# Patient Record
Sex: Female | Born: 1985 | Race: Asian | Hispanic: No | Marital: Married | Smoking: Never smoker
Health system: Southern US, Community
[De-identification: ages and names within clinical notes are randomized; demographics above are authoritative.]

## PROBLEM LIST (undated history)

## (undated) DIAGNOSIS — F0781 Postconcussional syndrome: Secondary | ICD-10-CM

## (undated) DIAGNOSIS — R519 Headache, unspecified: Secondary | ICD-10-CM

## (undated) DIAGNOSIS — R51 Headache: Secondary | ICD-10-CM

## (undated) HISTORY — DX: Postconcussional syndrome: F07.81

## (undated) HISTORY — DX: Headache: R51

## (undated) HISTORY — DX: Headache, unspecified: R51.9

---

## 2015-03-28 ENCOUNTER — Emergency Department (HOSPITAL_COMMUNITY)
Admission: EM | Admit: 2015-03-28 | Discharge: 2015-03-28 | Disposition: A | Discharge status: Home / Self Care | Payer: Medicaid Other | Source: Home / Self Care | Attending: Family Medicine | Admitting: Family Medicine

## 2015-03-28 ENCOUNTER — Encounter (HOSPITAL_COMMUNITY): Payer: Self-pay | Admitting: Emergency Medicine

## 2015-03-28 DIAGNOSIS — M7652 Patellar tendinitis, left knee: Secondary | ICD-10-CM

## 2015-03-28 MED ORDER — DICLOFENAC SODIUM 1 % TD GEL: 1.0000 "application " | Freq: Four times a day (QID) | TRANSDERMAL | Status: DC

## 2015-03-28 NOTE — Discharge Instructions (Signed)
Patellar Tendinitis, Jumper's Knee with Rehab Tendinitis is inflammation of a tendon. Tendonitis of the tendon below the kneecap (patella) is known as patellar tendonitis. Patellar tendonitis is a common cause of pain below the kneecap (infrapatellar). Patellar tendonitis may involve a tear (strain) in the ligament. Strains are classified into three categories. Grade 1 strains cause pain, but the tendon is not lengthened. Grade 2 strains include a lengthened ligament, due to the ligament being stretched or partially ruptured. With grade 2 strains there is still function, although function may be decreased. Grade 3 strains involve a complete tear of the tendon or muscle, and function is usually impaired. Patellar tendon strains are usually grade 1 or 2.  SYMPTOMS   Pain, tenderness, swelling, warmth, or redness over the patellar tendon (just below the kneecap).  Pain and loss of strength (sometimes), with forcefully straightening the knee (especially when jumping or rising from a seated or squatting position), or bending the knee completely (squatting or kneeling).  Crackling sound (crepitation) when the tendon is moved or touched. CAUSES  Patellar tendonitis is caused by injury to the patellar tendon. The inflammation is the body's healing response. Common causes of injury include:  Stress from a sudden increase in intensity, frequency, or duration of training.  Overuse of the thigh muscles (quadriceps) and patellar tendon.  Direct hit (trauma) to the knee or patellar tendon. RISK INCREASES WITH:  Sports that require sudden, explosive quadriceps contraction, such as jumping, quick starts, or kicking.  Running sports, especially running down hills.  Poor strength and flexibility of the thigh and knee.  Flat feet. PREVENTION  Warm up and stretch properly before activity.  Allow for adequate recovery between workouts.  Maintain physical fitness:  Strength, flexibility, and  endurance.  Cardiovascular fitness.  Protect the knee joint with taping, protective strapping, bracing, or elastic compression bandage.  Wear arch supports (orthotics). PROGNOSIS  If treated properly, patellar tendonitis usually heals within 6 weeks.  RELATED COMPLICATIONS   Longer healing time if not properly treated or if not given enough time to heal.  Recurring symptoms if activity is resumed too soon, with overuse, with a direct blow, or when using poor technique.  If untreated, tendon rupture requiring surgery. TREATMENT Treatment first involves the use of ice and medicine to reduce pain and inflammation. The use of strengthening and stretching exercises may help reduce pain with activity. These exercises may be performed at home or with a therapist. Serious cases of tendonitis may require restraining the knee for 10 to 14 days to prevent stress on the tendon and to promote healing. Crutches may be used (uncommon) until you can walk without a limp. For cases in which nonsurgical treatment is unsuccessful, surgery may be advised to remove the inflamed tendon lining (sheath). Surgery is rare, and is only advised after at least 6 months of nonsurgical treatment. MEDICATION   If pain medicine is needed, nonsteroidal anti-inflammatory medicines (aspirin and ibuprofen), or other minor pain relievers (acetaminophen), are often advised.  Do not take pain medicine for 7 days before surgery.  Prescription pain relievers may be given if your caregiver thinks they are needed. Use only as directed and only as much as you need. HEAT AND COLD  Cold treatment (icing) should be applied for 10 to 15 minutes every 2 to 3 hours for inflammation and pain, and immediately after activity that aggravates your symptoms. Use ice packs or an ice massage.  Heat treatment may be used before performing stretching and strengthening activities   prescribed by your caregiver, physical therapist, or athletic trainer.  Use a heat pack or a warm water soak. SEEK MEDICAL CARE IF:  Symptoms get worse or do not improve in 2 weeks, despite treatment.  New, unexplained symptoms develop. (Drugs used in treatment may produce side effects.) EXERCISES RANGE OF MOTION (ROM) AND STRETCHING EXERCISES - Patellar Tendinitis (Jumper's Knee) These are some of the initial exercises with which you may start your rehabilitation program, until you see your caregiver again or until your symptoms are resolved. Remember:   Flexible tissue is more tolerant of the stresses placed on it during activities.  Each stretch should be held for 20 to 30 seconds.  A gentle stretching sensation should be felt. STRETCH - Hamstrings, Supine  Lie on your back. Loop a belt or towel over the ball of your right / left foot.  Straighten your right / left knee and slowly pull on the belt to raise your leg. Do not allow the right / left knee to bend. Keep your opposite leg flat on the floor.  Raise the leg until you feel a gentle stretch behind your right / left knee or thigh. Hold this position for __________ seconds. Repeat __________ times. Complete this stretch __________ times per day.  STRETCH - Hamstrings, Doorway  Lie on your back with your right / left leg extended and resting on the wall, and the opposite leg flat on the ground through the door. At first, position your bottom farther away from the wall.  Keep your right / left knee straight. If you feel a stretch behind your knee or thigh, hold this position for __________ seconds.  If you do not feel a stretch, scoot your bottom closer to the door, and hold __________ seconds. Repeat __________ times. Complete this stretch __________ times per day.  STRETCH - Hamstrings, Standing  Stand or sit and extend your right / left leg, placing your foot on a chair or foot stool.  Keep a slight arch in your low back and your hips straight forward.  Lead with your chest and lean forward  at the waist until you feel a gentle stretch in the back of your right / left knee or thigh. (When done correctly, this exercise requires leaning only a small distance.)  Hold this position for __________ seconds. Repeat __________ times. Complete this stretch __________ times per day. STRETCH - Adductors, Lunge  While standing, spread your legs, with your right / left leg behind you.  Lean away from your right / left leg by bending your opposite knee. You may rest your hands on your thigh for balance.  You should feel a stretch in your right / left inner thigh. Hold for __________ seconds. Repeat __________ times. Complete this exercise __________ times per day.  STRENGTHENING EXERCISES - Patellar Tendinitis (Jumper's Knee) These exercises may help you when beginning to rehabilitate your injury. They may resolve your symptoms with or without further involvement from your physician, physical therapist or athletic trainer. While completing these exercises, remember:   Muscles can gain both the endurance and the strength needed for everyday activities through controlled exercises.  Complete these exercises as instructed by your physician, physical therapist or athletic trainer. Increase the resistance and repetitions only as guided by your caregiver. STRENGTH - Quadriceps, Isometrics  Lie on your back with your right / left leg extended and your opposite knee bent.  Gradually tense the muscles in the front of your right / left thigh. You should see   either your kneecap slide up toward your hip or increased dimpling just above the knee. This motion will push the back of the knee down toward the floor, mat, or bed on which you are lying.  Hold the muscle as tight as you can, without increasing your pain, for __________ seconds.  Relax the muscles slowly and completely in between each repetition. Repeat __________ times. Complete this exercise __________ times per day.  STRENGTH - Quadriceps,  Short Arcs  Lie on your back. Place a __________ inch towel roll under your right / left knee, so that the knee bends slightly.  Raise only your lower leg by tightening the muscles in the front of your thigh. Do not allow your thigh to rise.  Hold this position for __________ seconds. Repeat __________ times. Complete this exercise __________ times per day.  OPTIONAL ANKLE WEIGHTS: Begin with ____________________, but DO NOT exceed ____________________. Increase in 1 pound/ 0.5 kilogram increments. STRENGTH - Quadriceps, Straight Leg Raises  Quality counts! Watch for signs that the quadriceps muscle is working, to be sure you are strengthening the correct muscles and not "cheating" by substituting with healthier muscles.  Lay on your back with your right / left leg extended and your opposite knee bent.  Tense the muscles in the front of your right / left thigh. You should see either your kneecap slide up or increased dimpling just above the knee. Your thigh may even shake a bit.  Tighten these muscles even more and raise your leg 4 to 6 inches off the floor. Hold for __________ seconds.  Keeping these muscles tense, lower your leg.  Relax the muscles slowly and completely between each repetition. Repeat __________ times. Complete this exercise __________ times per day.  STRENGTH - Quadriceps, Squats  Stand in a door frame so that your feet and knees are in line with the frame.  Use your hands for balance, not support, on the frame.  Slowly lower your weight, bending at the hips and knees. Keep your lower legs upright so that they are parallel with the door frame. Squat only within the range that does not increase your knee pain. Never let your hips drop below your knees.  Slowly return upright, pushing with your legs, not pulling with your hands. Repeat __________ times. Complete this exercise __________ times per day.  STRENGTH - Quadriceps, Step-Downs  Stand on the edge of a step  stool or stair. Be prepared to use a countertop or wall for balance, if needed.  Keeping your right / left knee directly over the middle of your foot, slowly touch your opposite heel to the floor or lower step. Do not go all the way to the floor if your knee pain increases; just go as far as you can without increased discomfort. Use your right / left leg muscles, not gravity to lower your body weight.  Slowly push your body weight back up to the starting position. Repeat __________ times. Complete this exercise __________ times per day.  Document Released: 10/01/2005 Document Revised: 02/15/2014 Document Reviewed: 01/13/2009 [employer] Patient Information 2015 [employer]. This information is not intended to replace advice given to you by your health care provider. Make sure you discuss any questions you have with your health care provider.  

## 2015-03-28 NOTE — ED Notes (Signed)
Pt states that her left knee has been hurting for a week she denies any injury or fall

## 2015-03-28 NOTE — ED Provider Notes (Signed)
CSN: [id number]     Arrival date & time 03/28/15  1647 History   First MD Initiated Contact with Patient 03/28/15 1736     Chief Complaint  Patient presents with  . Knee Pain   (Consider location/radiation/quality/duration/timing/severity/associated sxs/prior Treatment) HPI Comments: 30 year old female with gradual onset of left anterior knee pain for one week. She states she does not recall any particular event with set of events that may have caused the pain. Her job does require standing for 8 hours a day working.   History reviewed. No pertinent past medical history. History reviewed. No pertinent past surgical history. History reviewed. No pertinent family history. History  Substance Use Topics  . Smoking status: Never Smoker   . Smokeless tobacco: Not on file  . Alcohol Use: No   OB History    No data available     Review of Systems  Constitutional: Negative for fever, chills and activity change.  HENT: Negative.   Respiratory: Negative.   Musculoskeletal:       As per HPI  Skin: Negative for color change.  Neurological: Negative.     Allergies  Review of patient's allergies indicates no known allergies.  Home Medications   Prior to Admission medications   Medication Sig Start Date End Date Taking? Authorizing Provider  diclofenac sodium (VOLTAREN) 1 % GEL Apply 1 application topically 4 (four) times daily. 03/28/15   Hayden Rasmussen, NP   BP 103/61 mmHg  Pulse 74  Temp(Src) 97.8 F (36.6 C) (Oral)  Resp 16  SpO2 100% Physical Exam  Constitutional: She is oriented to person, place, and time. She appears well-developed and well-nourished. No distress.  HENT:  Head: Normocephalic and atraumatic.  Eyes: EOM are normal.  Neck: Normal range of motion. Neck supple.  Pulmonary/Chest: Effort normal. No respiratory distress.  Musculoskeletal: Normal range of motion. She exhibits no edema.  Left knee without swelling, deformity or discoloration. Patient points to the  anterior knee and in particular the patella and patellar ligament as the source of pain. Palpation reveals these areas are tender. There are no areas of bony tenderness to the condyles or epicondyles. No tenderness over the anterior tibia or greater tuberosity.  Flexion and extension is complete. Distal neurovascular motor sensory is intact.  Neurological: She is alert and oriented to person, place, and time. No cranial nerve deficit.  Skin: Skin is warm and dry.  Psychiatric: She has a normal mood and affect.  Nursing note and vitals reviewed.   ED Course  Procedures (including critical care time) Labs Review Labs Reviewed - No data to display  Imaging Review No results found.   MDM   1. Patellar tendinitis of left knee    Ice, stretches, knee rehabilitation and exercise instructions given Diclofenac gel topically. Avoid activity that exacerbates pain. Knee sleeve. Follow-up with your PCP as needed.    Hayden Rasmussen, NP 03/28/15 [phone]

## 2016-01-20 ENCOUNTER — Ambulatory Visit
Admission: RE | Admit: 2016-01-20 | Discharge: 2016-01-20 | Disposition: A | Discharge status: Home / Self Care | Payer: No Typology Code available for payment source | Source: Ambulatory Visit | Attending: Infectious Disease | Admitting: Infectious Disease

## 2016-01-20 ENCOUNTER — Other Ambulatory Visit: Payer: Self-pay | Admitting: Infectious Disease

## 2016-01-20 DIAGNOSIS — Z111 Encounter for screening for respiratory tuberculosis: Secondary | ICD-10-CM

## 2017-05-31 ENCOUNTER — Ambulatory Visit: Payer: Self-pay | Admitting: *Deleted

## 2017-05-31 VITALS — BP 90/69 | HR 67 | Ht 60.0 in | Wt 139.0 lb

## 2017-05-31 DIAGNOSIS — Z0000 Encounter for general adult medical examination without abnormal findings: Secondary | ICD-10-CM

## 2017-05-31 NOTE — Progress Notes (Signed)
[employer] insurance premium discount evaluation: Labs Drawn. Replacements ROI form signed. Tobacco Free Attestation form signed.  Forms placed in paper chart.  No pcp to route results to. 

## 2017-06-01 LAB — CMP12+LP+TP+TSH+6AC+CBC/D/PLT
ALT: 16 IU/L (ref 0–32)
AST: 20 IU/L (ref 0–40)
Albumin/Globulin Ratio: 1.5 (ref 1.2–2.2)
Albumin: 4.3 g/dL (ref 3.5–5.5)
Alkaline Phosphatase: 47 IU/L (ref 39–117)
BASOS ABS: 0 10*3/uL (ref 0.0–0.2)
BILIRUBIN TOTAL: 0.7 mg/dL (ref 0.0–1.2)
BUN/Creatinine Ratio: 16 (ref 9–23)
BUN: 10 mg/dL (ref 6–20)
Basos: 0 %
CHLORIDE: 106 mmol/L (ref 96–106)
CHOL/HDL RATIO: 4.1 ratio (ref 0.0–4.4)
CREATININE: 0.62 mg/dL (ref 0.57–1.00)
Calcium: 9 mg/dL (ref 8.7–10.2)
Cholesterol, Total: 170 mg/dL (ref 100–199)
EOS (ABSOLUTE): 0.1 10*3/uL (ref 0.0–0.4)
ESTIMATED CHD RISK: 0.9 times avg. (ref 0.0–1.0)
Eos: 4 %
Free Thyroxine Index: 3.5 (ref 1.2–4.9)
GFR, EST AFRICAN AMERICAN: 139 mL/min/{1.73_m2} (ref >59)
GFR, EST NON AFRICAN AMERICAN: 121 mL/min/{1.73_m2} (ref >59)
GGT: 10 IU/L (ref 0–60)
Globulin, Total: 2.8 g/dL (ref 1.5–4.5)
Glucose: 91 mg/dL (ref 65–99)
HDL: 41 mg/dL (ref >39)
HEMATOCRIT: 37.3 % (ref 34.0–46.6)
Hemoglobin: 12.2 g/dL (ref 11.1–15.9)
IRON: 96 ug/dL (ref 27–159)
Immature Grans (Abs): 0 10*3/uL (ref 0.0–0.1)
Immature Granulocytes: 0 %
LDH: 202 IU/L (ref 119–226)
LDL CALC: 107 mg/dL — AB (ref 0–99)
LYMPHS ABS: 1.5 10*3/uL (ref 0.7–3.1)
Lymphs: 37 %
MCH: 28.4 pg (ref 26.6–33.0)
MCHC: 32.7 g/dL (ref 31.5–35.7)
MCV: 87 fL (ref 79–97)
Monocytes Absolute: 0.2 10*3/uL (ref 0.1–0.9)
Monocytes: 6 %
NEUTROS ABS: 2.1 10*3/uL (ref 1.4–7.0)
Neutrophils: 53 %
Phosphorus: 3.2 mg/dL (ref 2.5–4.5)
Platelets: 252 10*3/uL (ref 150–379)
Potassium: 4.3 mmol/L (ref 3.5–5.2)
RBC: 4.3 x10E6/uL (ref 3.77–5.28)
RDW: 13 % (ref 12.3–15.4)
SODIUM: 141 mmol/L (ref 134–144)
T3 UPTAKE RATIO: 38 % (ref 24–39)
T4, Total: 9.1 ug/dL (ref 4.5–12.0)
TSH: 1.74 u[IU]/mL (ref 0.450–4.500)
Total Protein: 7.1 g/dL (ref 6.0–8.5)
Triglycerides: 110 mg/dL (ref 0–149)
Uric Acid: 4.5 mg/dL (ref 2.5–7.1)
VLDL CHOLESTEROL CAL: 22 mg/dL (ref 5–40)
WBC: 4 10*3/uL (ref 3.4–10.8)

## 2017-06-01 LAB — HGB A1C W/O EAG: HEMOGLOBIN A1C: 5.3 % (ref 4.8–5.6)

## 2017-06-04 NOTE — Progress Notes (Signed)
Results reviewed with pt. LDL slightly elevated. No comparison labs in epic or paper chart as pt is a new employee. All other labs WNL. Diet and exercise recommendations given for weight management and LDL improvement. Copy provided to pt. No pcp to route results to. No further questions/concerns.

## 2017-10-15 DIAGNOSIS — M549 Dorsalgia, unspecified: Secondary | ICD-10-CM

## 2017-10-15 HISTORY — DX: Dorsalgia, unspecified: M54.9

## 2017-11-04 ENCOUNTER — Other Ambulatory Visit: Payer: Self-pay

## 2017-11-04 ENCOUNTER — Encounter (HOSPITAL_COMMUNITY): Payer: Self-pay | Admitting: Emergency Medicine

## 2017-11-04 ENCOUNTER — Ambulatory Visit (HOSPITAL_COMMUNITY)
Admission: EM | Admit: 2017-11-04 | Discharge: 2017-11-04 | Disposition: A | Discharge status: Home / Self Care | Payer: PRIVATE HEALTH INSURANCE | Attending: Family Medicine | Admitting: Family Medicine

## 2017-11-04 DIAGNOSIS — R21 Rash and other nonspecific skin eruption: Secondary | ICD-10-CM | POA: Diagnosis not present

## 2017-11-04 MED ORDER — PREDNISONE 10 MG (21) PO TBPK: ORAL_TABLET | Freq: Every day | ORAL | 0 refills | Status: DC

## 2017-11-04 NOTE — ED Triage Notes (Signed)
Pt c/o rash bilateral legs x2 weeks, states its also on the lower part of her stomach. Denies new soaps or creams.

## 2017-11-06 NOTE — ED Provider Notes (Signed)
  [name]   [id number] 11/04/17 Arrival Time: 1307  ASSESSMENT & PLAN:  1. Rash and nonspecific skin eruption    Meds ordered this encounter  Medications  . predniSONE (STERAPRED UNI-PAK 21 TAB) 10 MG (21) TBPK tablet    Sig: Take by mouth daily. Take as directed.    Dispense:  21 tablet    Refill:  0   Unsure of exact etiology. Does not appear typical of a vasculitis. Trial of prednisone. If not improving she plans to schedule f/u with a dermatologist. May f/u here as needed.  Reviewed expectations re: course of current medical issues. Questions answered. Outlined signs and symptoms indicating need for more acute intervention. Patient verbalized understanding. After Visit Summary given.   SUBJECTIVE:  Hailey Ferguson is a 32 y.o. female who presents with complaint of:   Rash Patient presents for evaluation of a localized rash involving her lower legs and slighlty on her torso. Onset gradual, 2-3 weeks ago. Reports that the rash has not changed over time and causes no discomfort. No itching. Associated symptoms: none. She denies: abdominal pain, arthralgia, fever, myalgia and sore throat. Reports that she has not had contacts with similar rash. She has not had new exposures (soaps, lotions, laundry detergents, foods, medications, plants, insects or animals). She @has  not identified precipitant. Environmental exposures or allergies: none No recent travel.  ROS: As per HPI.  OBJECTIVE: Vitals:   11/04/17 1415  BP: 99/70  Pulse: 74  Resp: 16  Temp: 98.1 F (36.7 C)  SpO2: 100%    General appearance: alert; no distress Lungs: clear to auscultation bilaterally Heart: regular rate and rhythm Extremities: no edema Skin: warm and dry; non-specific scattered red, small areas (approx 2-735mm) areas of slight erythema on her lower extremities; some on lower abdomen but difficult to make out; non-palpable; some blanch and some do not blanch; little confluence  overall Psychological: alert and cooperative; normal mood and affect  No Known Allergies   Social History   Socioeconomic History  . Marital status: Married    Spouse name: Not on file  . Number of children: Not on file  . Years of education: Not on file  . Highest education level: Not on file  Social Needs  . Financial resource strain: Not on file  . Food insecurity - worry: Not on file  . Food insecurity - inability: Not on file  . Transportation needs - medical: Not on file  . Transportation needs - non-medical: Not on file  Occupational History  . Not on file  Tobacco Use  . Smoking status: Never Smoker  Substance and Sexual Activity  . Alcohol use: No  . Drug use: No  . Sexual activity: Yes  Other Topics Concern  . Not on file  Social History Narrative  . Not on file      Mardella LaymanHagler, Madhavi Hamblen, MD 11/06/17 [phone]

## 2018-05-29 ENCOUNTER — Emergency Department (HOSPITAL_COMMUNITY): Payer: No Typology Code available for payment source

## 2018-05-29 ENCOUNTER — Observation Stay (HOSPITAL_COMMUNITY): Payer: No Typology Code available for payment source

## 2018-05-29 ENCOUNTER — Encounter (HOSPITAL_COMMUNITY): Payer: Self-pay | Admitting: Emergency Medicine

## 2018-05-29 ENCOUNTER — Emergency Department (HOSPITAL_COMMUNITY): Payer: No Typology Code available for payment source | Admitting: Certified Registered Nurse Anesthetist

## 2018-05-29 ENCOUNTER — Observation Stay (HOSPITAL_COMMUNITY)
Admission: EM | Admit: 2018-05-29 | Discharge: 2018-05-30 | Disposition: A | Discharge status: Home / Self Care | Payer: No Typology Code available for payment source | Attending: Student | Admitting: Student

## 2018-05-29 ENCOUNTER — Encounter (HOSPITAL_COMMUNITY)
Admission: EM | Disposition: A | Discharge status: Home / Self Care | Payer: Self-pay | Source: Home / Self Care | Attending: Emergency Medicine

## 2018-05-29 ENCOUNTER — Other Ambulatory Visit: Payer: Self-pay

## 2018-05-29 DIAGNOSIS — S73006A Unspecified dislocation of unspecified hip, initial encounter: Secondary | ICD-10-CM

## 2018-05-29 DIAGNOSIS — S7290XA Unspecified fracture of unspecified femur, initial encounter for closed fracture: Secondary | ICD-10-CM

## 2018-05-29 DIAGNOSIS — S73005A Unspecified dislocation of left hip, initial encounter: Secondary | ICD-10-CM | POA: Diagnosis not present

## 2018-05-29 DIAGNOSIS — M79652 Pain in left thigh: Secondary | ICD-10-CM | POA: Diagnosis present

## 2018-05-29 DIAGNOSIS — Z419 Encounter for procedure for purposes other than remedying health state, unspecified: Secondary | ICD-10-CM

## 2018-05-29 DIAGNOSIS — T1490XA Injury, unspecified, initial encounter: Secondary | ICD-10-CM

## 2018-05-29 HISTORY — PX: HIP CLOSED REDUCTION: SHX983

## 2018-05-29 HISTORY — DX: Unspecified dislocation of unspecified hip, initial encounter: S73.006A

## 2018-05-29 LAB — CBC WITH DIFFERENTIAL/PLATELET
Abs Immature Granulocytes: 0 10*3/uL (ref 0.0–0.1)
BASOS PCT: 0 %
Basophils Absolute: 0 10*3/uL (ref 0.0–0.1)
Eosinophils Absolute: 0.1 10*3/uL (ref 0.0–0.7)
Eosinophils Relative: 1 %
HCT: 35.8 % — ABNORMAL LOW (ref 36.0–46.0)
Hemoglobin: 11.4 g/dL — ABNORMAL LOW (ref 12.0–15.0)
IMMATURE GRANULOCYTES: 0 %
Lymphocytes Relative: 29 %
Lymphs Abs: 2.9 10*3/uL (ref 0.7–4.0)
MCH: 28.6 pg (ref 26.0–34.0)
MCHC: 31.8 g/dL (ref 30.0–36.0)
MCV: 89.9 fL (ref 78.0–100.0)
MONOS PCT: 6 %
Monocytes Absolute: 0.6 10*3/uL (ref 0.1–1.0)
NEUTROS ABS: 6.4 10*3/uL (ref 1.7–7.7)
NEUTROS PCT: 64 %
Platelets: 262 10*3/uL (ref 150–400)
RBC: 3.98 MIL/uL (ref 3.87–5.11)
RDW: 13.1 % (ref 11.5–15.5)
WBC: 10.2 10*3/uL (ref 4.0–10.5)

## 2018-05-29 LAB — BASIC METABOLIC PANEL
Anion gap: 10 (ref 5–15)
BUN: 8 mg/dL (ref 6–20)
CHLORIDE: 108 mmol/L (ref 98–111)
CO2: 19 mmol/L — ABNORMAL LOW (ref 22–32)
CREATININE: 0.76 mg/dL (ref 0.44–1.00)
Calcium: 8.4 mg/dL — ABNORMAL LOW (ref 8.9–10.3)
GFR calc Af Amer: >60 mL/min (ref >60)
GFR calc non Af Amer: >60 mL/min (ref >60)
Glucose, Bld: 133 mg/dL — ABNORMAL HIGH (ref 70–99)
Potassium: 3 mmol/L — ABNORMAL LOW (ref 3.5–5.1)
Sodium: 137 mmol/L (ref 135–145)

## 2018-05-29 LAB — TYPE AND SCREEN
ABO/RH(D): O POS
Antibody Screen: NEGATIVE

## 2018-05-29 LAB — I-STAT BETA HCG BLOOD, ED (MC, WL, AP ONLY)

## 2018-05-29 LAB — ABO/RH: ABO/RH(D): O POS

## 2018-05-29 SURGERY — CLOSED REDUCTION, HIP
Anesthesia: General | Site: Hip | Laterality: Left

## 2018-05-29 MED ORDER — ONDANSETRON HCL 4 MG PO TABS: 4.0000 mg | ORAL_TABLET | Freq: Four times a day (QID) | ORAL | Status: DC | PRN

## 2018-05-29 MED ORDER — SUGAMMADEX SODIUM 200 MG/2ML IV SOLN
INTRAVENOUS | Status: DC | PRN
  Administered 2018-05-29 (×2): 200 mg via INTRAVENOUS

## 2018-05-29 MED ORDER — OXYCODONE HCL 5 MG PO TABS
5.0000 mg | ORAL_TABLET | Freq: Once | ORAL | Status: AC | PRN
  Administered 2018-05-29: 5 mg via ORAL

## 2018-05-29 MED ORDER — OXYCODONE HCL 5 MG PO TABS
ORAL_TABLET | ORAL | Status: AC
  Filled 2018-05-29: qty 1

## 2018-05-29 MED ORDER — HYDROMORPHONE HCL 1 MG/ML IJ SOLN
1.0000 mg | INTRAMUSCULAR | Status: DC | PRN
  Administered 2018-05-30: 1 mg via INTRAVENOUS
  Filled 2018-05-29: qty 1

## 2018-05-29 MED ORDER — PROPOFOL 10 MG/ML IV BOLUS
INTRAVENOUS | Status: AC
  Filled 2018-05-29: qty 20

## 2018-05-29 MED ORDER — METHOCARBAMOL 500 MG PO TABS
ORAL_TABLET | ORAL | Status: AC
  Filled 2018-05-29: qty 1

## 2018-05-29 MED ORDER — ACETAMINOPHEN 500 MG PO TABS
500.0000 mg | ORAL_TABLET | Freq: Two times a day (BID) | ORAL | Status: DC
  Administered 2018-05-29 – 2018-05-30 (×2): 500 mg via ORAL
  Filled 2018-05-29 (×2): qty 1

## 2018-05-29 MED ORDER — ACETAMINOPHEN 325 MG PO TABS: 650.0000 mg | ORAL_TABLET | Freq: Four times a day (QID) | ORAL | Status: DC | PRN

## 2018-05-29 MED ORDER — LACTATED RINGERS IV SOLN
INTRAVENOUS | Status: DC | PRN
  Administered 2018-05-29: 19:00:00 via INTRAVENOUS

## 2018-05-29 MED ORDER — SUCCINYLCHOLINE CHLORIDE 20 MG/ML IJ SOLN
INTRAMUSCULAR | Status: DC | PRN
  Administered 2018-05-29: 80 mg via INTRAVENOUS
  Administered 2018-05-29: 40 mg via INTRAVENOUS

## 2018-05-29 MED ORDER — KETAMINE HCL 10 MG/ML IJ SOLN
INTRAMUSCULAR | Status: AC | PRN
  Administered 2018-05-29: 35 mg via INTRAVENOUS

## 2018-05-29 MED ORDER — KETAMINE HCL 50 MG/5ML IJ SOSY
1.0000 mg/kg | PREFILLED_SYRINGE | Freq: Once | INTRAMUSCULAR | Status: DC
  Filled 2018-05-29: qty 10

## 2018-05-29 MED ORDER — PROPOFOL 10 MG/ML IV BOLUS
INTRAVENOUS | Status: AC | PRN
  Administered 2018-05-29: 35 mg via INTRAVENOUS

## 2018-05-29 MED ORDER — FENTANYL CITRATE (PF) 100 MCG/2ML IJ SOLN
INTRAMUSCULAR | Status: AC
  Filled 2018-05-29: qty 2

## 2018-05-29 MED ORDER — LACTATED RINGERS IV SOLN
INTRAVENOUS | Status: DC
  Administered 2018-05-29: 19:00:00 via INTRAVENOUS

## 2018-05-29 MED ORDER — MIDAZOLAM HCL 5 MG/5ML IJ SOLN
INTRAMUSCULAR | Status: DC | PRN
  Administered 2018-05-29: 1 mg via INTRAVENOUS

## 2018-05-29 MED ORDER — FENTANYL CITRATE (PF) 250 MCG/5ML IJ SOLN
INTRAMUSCULAR | Status: AC
  Filled 2018-05-29: qty 5

## 2018-05-29 MED ORDER — KETOROLAC TROMETHAMINE 30 MG/ML IJ SOLN
INTRAMUSCULAR | Status: DC | PRN
  Administered 2018-05-29: 30 mg via INTRAVENOUS

## 2018-05-29 MED ORDER — ONDANSETRON HCL 4 MG/2ML IJ SOLN
INTRAMUSCULAR | Status: DC | PRN
  Administered 2018-05-29: 4 mg via INTRAVENOUS

## 2018-05-29 MED ORDER — MIDAZOLAM HCL 2 MG/2ML IJ SOLN
INTRAMUSCULAR | Status: AC
  Filled 2018-05-29: qty 2

## 2018-05-29 MED ORDER — ONDANSETRON HCL 4 MG/2ML IJ SOLN
4.0000 mg | Freq: Four times a day (QID) | INTRAMUSCULAR | Status: DC | PRN
  Administered 2018-05-30: 4 mg via INTRAVENOUS
  Filled 2018-05-29: qty 2

## 2018-05-29 MED ORDER — ROCURONIUM BROMIDE 100 MG/10ML IV SOLN
INTRAVENOUS | Status: DC | PRN
  Administered 2018-05-29: 50 mg via INTRAVENOUS

## 2018-05-29 MED ORDER — METHOCARBAMOL 1000 MG/10ML IJ SOLN
500.0000 mg | Freq: Four times a day (QID) | INTRAVENOUS | Status: DC | PRN
  Filled 2018-05-29: qty 5

## 2018-05-29 MED ORDER — OXYCODONE-ACETAMINOPHEN 5-325 MG PO TABS
1.0000 | ORAL_TABLET | ORAL | Status: DC | PRN
  Administered 2018-05-30: 1 via ORAL
  Filled 2018-05-29: qty 1

## 2018-05-29 MED ORDER — PROPOFOL 10 MG/ML IV BOLUS
0.5000 mg/kg | Freq: Once | INTRAVENOUS | Status: DC
  Filled 2018-05-29: qty 20

## 2018-05-29 MED ORDER — OXYCODONE-ACETAMINOPHEN 5-325 MG PO TABS: 2.0000 | ORAL_TABLET | Freq: Four times a day (QID) | ORAL | Status: DC | PRN

## 2018-05-29 MED ORDER — SODIUM CHLORIDE 0.9 % IV SOLN
INTRAVENOUS | Status: DC
  Administered 2018-05-29: 23:00:00 via INTRAVENOUS

## 2018-05-29 MED ORDER — METHOCARBAMOL 500 MG PO TABS
500.0000 mg | ORAL_TABLET | Freq: Four times a day (QID) | ORAL | Status: DC | PRN
  Administered 2018-05-29 – 2018-05-30 (×2): 500 mg via ORAL
  Filled 2018-05-29: qty 1

## 2018-05-29 MED ORDER — OXYCODONE HCL 5 MG/5ML PO SOLN: 5.0000 mg | Freq: Once | ORAL | Status: AC | PRN

## 2018-05-29 MED ORDER — LIDOCAINE 2% (20 MG/ML) 5 ML SYRINGE
INTRAMUSCULAR | Status: AC
  Filled 2018-05-29: qty 5

## 2018-05-29 MED ORDER — CEFAZOLIN SODIUM-DEXTROSE 2-4 GM/100ML-% IV SOLN
INTRAVENOUS | Status: AC
  Filled 2018-05-29: qty 100

## 2018-05-29 MED ORDER — ASPIRIN 325 MG PO TABS
325.0000 mg | ORAL_TABLET | Freq: Every day | ORAL | Status: DC
  Administered 2018-05-29 – 2018-05-30 (×2): 325 mg via ORAL
  Filled 2018-05-29 (×2): qty 1

## 2018-05-29 MED ORDER — ACETAMINOPHEN 650 MG RE SUPP: 650.0000 mg | Freq: Four times a day (QID) | RECTAL | Status: DC | PRN

## 2018-05-29 MED ORDER — FENTANYL CITRATE (PF) 100 MCG/2ML IJ SOLN
25.0000 ug | INTRAMUSCULAR | Status: DC | PRN
  Administered 2018-05-29 (×2): 50 ug via INTRAVENOUS

## 2018-05-29 MED ORDER — DIPHENHYDRAMINE HCL 12.5 MG/5ML PO ELIX: 12.5000 mg | ORAL_SOLUTION | ORAL | Status: DC | PRN

## 2018-05-29 SURGICAL SUPPLY — 48 items
BLADE SAW SAG 73X25 THK (BLADE)
BLADE SAW SGTL 73X25 THK (BLADE) IMPLANT
BNDG COHESIVE 6X5 TAN STRL LF (GAUZE/BANDAGES/DRESSINGS) IMPLANT
BRUSH FEMORAL CANAL (MISCELLANEOUS) IMPLANT
BRUSH SCRUB SURG 4.25 DISP (MISCELLANEOUS) IMPLANT
CHLORAPREP W/TINT 26ML (MISCELLANEOUS) IMPLANT
COVER SURGICAL LIGHT HANDLE (MISCELLANEOUS) IMPLANT
DERMABOND ADVANCED (GAUZE/BANDAGES/DRESSINGS)
DERMABOND ADVANCED .7 DNX12 (GAUZE/BANDAGES/DRESSINGS) IMPLANT
DRAPE HIP W/POCKET STRL (DRAPE) IMPLANT
DRAPE INCISE IOBAN 85X60 (DRAPES) IMPLANT
DRAPE ORTHO SPLIT 77X108 STRL (DRAPES)
DRAPE SURG 17X23 STRL (DRAPES) IMPLANT
DRAPE SURG ORHT 6 SPLT 77X108 (DRAPES) IMPLANT
DRAPE U-SHAPE 47X51 STRL (DRAPES) IMPLANT
DRSG MEPILEX BORDER 4X8 (GAUZE/BANDAGES/DRESSINGS) IMPLANT
ELECT BLADE 6.5 EXT (BLADE) IMPLANT
ELECT CAUTERY BLADE 6.4 (BLADE) IMPLANT
ELECT REM PT RETURN 9FT ADLT (ELECTROSURGICAL)
ELECTRODE REM PT RTRN 9FT ADLT (ELECTROSURGICAL) IMPLANT
GLOVE BIO SURGEON STRL SZ7.5 (GLOVE) IMPLANT
GLOVE BIOGEL PI IND STRL 7.5 (GLOVE) IMPLANT
GLOVE BIOGEL PI INDICATOR 7.5 (GLOVE)
GOWN STRL REUS W/ TWL LRG LVL3 (GOWN DISPOSABLE) IMPLANT
GOWN STRL REUS W/TWL LRG LVL3 (GOWN DISPOSABLE)
HANDPIECE INTERPULSE COAX TIP (DISPOSABLE)
IMMOBILIZER KNEE 20 (SOFTGOODS) ×4
IMMOBILIZER KNEE 20 THIGH 36 (SOFTGOODS) ×2 IMPLANT
KIT BASIN OR (CUSTOM PROCEDURE TRAY) IMPLANT
KIT TURNOVER KIT B (KITS) IMPLANT
MANIFOLD NEPTUNE II (INSTRUMENTS) IMPLANT
NEEDLE 1/2 CIR MAYO (NEEDLE) IMPLANT
NS IRRIG 1000ML POUR BTL (IV SOLUTION) IMPLANT
PACK TOTAL JOINT (CUSTOM PROCEDURE TRAY) IMPLANT
PAD ARMBOARD 7.5X6 YLW CONV (MISCELLANEOUS) IMPLANT
PRESSURIZER FEMORAL UNIV (MISCELLANEOUS) IMPLANT
SET HNDPC FAN SPRY TIP SCT (DISPOSABLE) IMPLANT
STAPLER VISISTAT 35W (STAPLE) IMPLANT
STOCKINETTE IMPERVIOUS LG (DRAPES) IMPLANT
SUT MNCRL AB 3-0 PS2 18 (SUTURE) IMPLANT
SUT VIC AB 0 CT1 27 (SUTURE)
SUT VIC AB 0 CT1 27XBRD ANBCTR (SUTURE) IMPLANT
SUT VIC AB 1 CTX 18 (SUTURE) IMPLANT
SUT VIC AB 2-0 CT1 27 (SUTURE)
SUT VIC AB 2-0 CT1 TAPERPNT 27 (SUTURE) IMPLANT
TOWEL OR 17X26 10 PK STRL BLUE (TOWEL DISPOSABLE) IMPLANT
TOWER CARTRIDGE SMART MIX (DISPOSABLE) IMPLANT
WATER STERILE IRR 1000ML POUR (IV SOLUTION) IMPLANT

## 2018-05-29 NOTE — Sedation Documentation (Signed)
Report given to OR holding , [facility] [employer]

## 2018-05-29 NOTE — Plan of Care (Signed)

## 2018-05-29 NOTE — Progress Notes (Signed)
Orthopedic Tech Progress Note Patient Details:  Ihor GullyBhim Maya Pense 09-Feb-1986 [id number]  Ortho Devices Type of Ortho Device: Knee Immobilizer       Saul FordyceJennifer C Serigne Kubicek 05/29/2018, 5:45 PM
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
## 2018-05-29 NOTE — Progress Notes (Signed)
CT of cervical spine negative.  Per Dr. Jena GaussHaddix, okay to remove C-collar.
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
## 2018-05-29 NOTE — Anesthesia Preprocedure Evaluation (Addendum)
Anesthesia Evaluation  Patient identified by MRN, date of birth, ID band Patient awake    Reviewed: Allergy & Precautions, NPO status , Patient's Chart, lab work & pertinent test results  History of Anesthesia Complications Negative for: history of anesthetic complications  Airway Mallampati: II  TM Distance: >3 FB Neck ROM: Limited    Dental  (+) Teeth Intact   Pulmonary neg pulmonary ROS,    breath sounds clear to auscultation       Cardiovascular negative cardio ROS   Rhythm:Regular     Neuro/Psych negative neurological ROS  negative psych ROS   GI/Hepatic negative GI ROS, Neg liver ROS,   Endo/Other  negative endocrine ROS  Renal/GU negative Renal ROS     Musculoskeletal Hip dislocation    Abdominal   Peds  Hematology negative hematology ROS (+)   Anesthesia Other Findings   Reproductive/Obstetrics                             Anesthesia Physical Anesthesia Plan  ASA: I  Anesthesia Plan: General   Post-op Pain Management:    Induction: Intravenous  PONV Risk Score and Plan: 3 and Ondansetron, Dexamethasone and Treatment may vary due to age or medical condition  Airway Management Planned: Mask  Additional Equipment: None  Intra-op Plan:   Post-operative Plan: Extubation in OR  Informed Consent: I have reviewed the patients History and Physical, chart, labs and discussed the procedure including the risks, benefits and alternatives for the proposed anesthesia with the patient or authorized representative who has indicated his/her understanding and acceptance.   Dental advisory given  Plan Discussed with: Surgeon  Anesthesia Plan Comments:         Anesthesia Quick Evaluation

## 2018-05-29 NOTE — Transfer of Care (Signed)
Immediate Anesthesia Transfer of Care Note  Patient: Hailey Ferguson  Procedure(s) Performed: CLOSED REDUCTION HIP (Left Hip)  Patient Location: PACU  Anesthesia Type:General  Level of Consciousness: drowsy  Airway & Oxygen Therapy: Patient Spontanous Breathing and Patient connected to face mask oxygen  Post-op Assessment: Report given to RN and Post -op Vital signs reviewed and stable  Post vital signs: Reviewed and stable  Last Vitals:  Vitals Value Taken Time  BP 91/63 05/29/2018  7:36 PM  Temp 36.8 C 05/29/2018  7:36 PM  Pulse 107 05/29/2018  7:39 PM  Resp 16 05/29/2018  7:41 PM  SpO2 100 % 05/29/2018  7:39 PM  Vitals shown include unvalidated device data.  Last Pain:  Vitals:   05/29/18 1826  TempSrc:   PainSc: 5          Complications: No apparent anesthesia complications

## 2018-05-30 ENCOUNTER — Encounter (HOSPITAL_COMMUNITY): Payer: Self-pay | Admitting: Student

## 2018-05-30 MED ORDER — METHOCARBAMOL 750 MG PO TABS: 750.0000 mg | ORAL_TABLET | Freq: Four times a day (QID) | ORAL | 0 refills | Status: DC | PRN

## 2018-05-30 MED ORDER — ASPIRIN 325 MG PO TABS: 325.0000 mg | ORAL_TABLET | Freq: Every day | ORAL | 0 refills | Status: DC

## 2018-05-30 MED ORDER — PROMETHAZINE HCL 25 MG/ML IJ SOLN
12.5000 mg | Freq: Four times a day (QID) | INTRAMUSCULAR | Status: DC | PRN
  Administered 2018-05-30: 12.5 mg via INTRAVENOUS
  Filled 2018-05-30: qty 1

## 2018-05-30 MED ORDER — OXYCODONE-ACETAMINOPHEN 5-325 MG PO TABS: 1.0000 | ORAL_TABLET | ORAL | 0 refills | Status: DC | PRN
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
## 2018-05-30 NOTE — Discharge Instructions (Signed)
[facility] Discharge Instructions   General Discharge Instructions  WEIGHT BEARING STATUS: Touchdown weight bearing to left leg  RANGE OF MOTION/ACTIVITY: Follow hip precautions directed by the physical therapy. Limit flexion of the hip as well as crossing over of your leg  DVT/PE prophylaxis: Aspirin 325 mg daily  Diet: as you were eating previously.  Can use over the counter stool softeners and bowel preparations, such as Miralax, to help with bowel movements.  Narcotics can be constipating.  Be sure to drink plenty of fluids  PAIN MEDICATION USE AND EXPECTATIONS  You have likely been given narcotic medications to help control your pain.  After a traumatic event that results in an fracture (broken bone) with or without surgery, it is ok to use narcotic pain medications to help control one's pain.  We understand that everyone responds to pain differently and each individual patient will be evaluated on a regular basis for the continued need for narcotic medications. Ideally, narcotic medication use should last no more than 6-8 weeks (coinciding with fracture healing).   As a patient it is your responsibility as well to monitor narcotic medication use and report the amount and frequency you use these medications when you come to your office visit.   We would also advise that if you are using narcotic medications, you should take a dose prior to therapy to maximize you participation.  IF YOU ARE ON NARCOTIC MEDICATIONS IT IS NOT PERMISSIBLE TO OPERATE A MOTOR VEHICLE (MOTORCYCLE/CAR/TRUCK/MOPED) OR HEAVY MACHINERY DO NOT MIX NARCOTICS WITH OTHER CNS (CENTRAL NERVOUS SYSTEM) DEPRESSANTS SUCH AS ALCOHOL  ICE AND ELEVATE INJURED/OPERATIVE EXTREMITY  Using ice and elevating the injured extremity above your heart can help with swelling and pain control.  Icing in a pulsatile fashion, such as 20 minutes on and 20 minutes off, can be followed.    Do not place ice directly on skin. Make  sure there is a barrier between to skin and the ice pack.    Using frozen items such as frozen peas works well as the conform nicely to the are that needs to be iced.  USE AN ACE WRAP OR TED HOSE FOR SWELLING CONTROL  In addition to icing and elevation, Ace wraps or TED hose are used to help limit and resolve swelling.  It is recommended to use Ace wraps or TED hose until you are informed to stop.    When using Ace Wraps start the wrapping distally (farthest away from the body) and wrap proximally (closer to the body)   Example: If you had surgery on your leg or thing and you do not have a splint on, start the ace wrap at the toes and work your way up to the thigh        If you had surgery on your upper extremity and do not have a splint on, start the ace wrap at your fingers and work your way up to the upper arm  CALL THE OFFICE WITH ANY QUESTIONS OR CONCERNS: [phone]    YOU SHOULD CALL DR. Aundria RudOGERS OFFICE TO SET UP AN APPOINTMENT TO DISCUSS POSSIBLE HIP ARTHROSCOPY.

## 2018-05-30 NOTE — Discharge Summary (Signed)
Orthopaedic Trauma Service (OTS)  Patient ID: Hailey Ferguson MRN: [id number] DOB/AGE: [birth year] 31 y.o.  Admit date: 05/29/2018 Discharge date: 05/30/2018  Admission Diagnoses:Closed dislocation of left hip [facility](HCC)  Discharge Diagnoses:  Active Problems:   Closed dislocation of left hip (HCC)   History reviewed. No pertinent past medical history.   Procedures Performed: 05/30/18: CPT 27252-Closed reduction of left hip  Discharged Condition: good  Hospital Course: Patient was admitted following a closed reduction in the operating room for her left hip dislocation.  She was kept overnight for observation.  She was cleared for discharge home by physical therapy.  Her pain was well controlled with oral medications.  She was tolerating regular diet.  She was discharged home on postoperative day 1.  Consults: None  Significant Diagnostic Studies: None  Treatments: surgery: as above  Discharge Exam: General: No acute distress, awake alert and oriented x3 Left lower extremity: Pain about the knee and thigh.  Compartments are soft compressible.  Knee is stable.  No effusion on exam.  Active dorsiflexion plantarflexion of the foot and great toe.  She endorses sensation in the dorsum plantar aspect of her foot.  Right lower extremity with bruising over the anterior tibia.  No instability or deformity.  Disposition: Discharge disposition: 01-Home or Self Care        Allergies as of 05/30/2018   No Known Allergies     Medication List    TAKE these medications   aspirin 325 MG tablet Take 1 tablet (325 mg total) by mouth daily. Start taking on:  05/31/2018   methocarbamol 750 MG tablet Commonly known as:  ROBAXIN Take 1 tablet (750 mg total) by mouth every 6 (six) hours as needed for muscle spasms.   oxyCODONE-acetaminophen 5-325 MG tablet Commonly known as:  PERCOCET/ROXICET Take 1 tablet by mouth every 4 (four) hours as needed for moderate pain.      Follow-up  Information    Yolonda Kidaogers, Jason Patrick, MD. Schedule an appointment as soon as possible for a visit in 1 week(s).   Specialty:  Orthopedic Surgery Contact information: [address] [place] [address] [phone]           Discharge Instructions and Plan: Patient will be touchdown weightbearing to the left lower extremity until she seen by Dr. Aundria Rudogers for evaluation of possible hip arthroscopy.  She will be on aspirin for DVT prophylaxis.  Signed:  Roby LoftsKevin P. Haddix, MD [facility] 05/30/2018, 1:06 PM

## 2018-05-30 NOTE — Anesthesia Postprocedure Evaluation (Signed)
Anesthesia Post Note  Patient: Hailey Ferguson  Procedure(s) Performed: CLOSED REDUCTION HIP (Left Hip)     Patient location during evaluation: PACU Anesthesia Type: General Level of consciousness: awake and alert Pain management: pain level controlled Vital Signs Assessment: post-procedure vital signs reviewed and stable Respiratory status: spontaneous breathing, nonlabored ventilation, respiratory function stable and patient connected to nasal cannula oxygen Cardiovascular status: blood pressure returned to baseline and stable Postop Assessment: no apparent nausea or vomiting Anesthetic complications: no    Last Vitals:  Vitals:   05/29/18 2357 05/30/18 0453  BP: 108/70 (!) 98/58  Pulse: 94 85  Resp: 18 20  Temp: 36.9 C 37.1 C  SpO2: 100% 99%    Last Pain:  Vitals:   05/30/18 0550  TempSrc:   PainSc: Asleep                 Reiss Mowrey
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
## 2018-06-03 ENCOUNTER — Telehealth: Payer: Self-pay | Admitting: Registered Nurse
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
## 2018-06-04 ENCOUNTER — Emergency Department (HOSPITAL_COMMUNITY)
Admission: EM | Admit: 2018-06-04 | Discharge: 2018-06-04 | Disposition: A | Discharge status: Home / Self Care | Payer: No Typology Code available for payment source | Attending: Emergency Medicine | Admitting: Emergency Medicine

## 2018-06-04 ENCOUNTER — Encounter (HOSPITAL_COMMUNITY): Payer: Self-pay | Admitting: Emergency Medicine

## 2018-06-04 ENCOUNTER — Emergency Department (HOSPITAL_COMMUNITY): Payer: No Typology Code available for payment source

## 2018-06-04 DIAGNOSIS — Z960 Presence of urogenital implants: Secondary | ICD-10-CM | POA: Diagnosis not present

## 2018-06-04 DIAGNOSIS — R1031 Right lower quadrant pain: Secondary | ICD-10-CM | POA: Insufficient documentation

## 2018-06-04 DIAGNOSIS — R39198 Other difficulties with micturition: Secondary | ICD-10-CM | POA: Insufficient documentation

## 2018-06-04 DIAGNOSIS — Z7982 Long term (current) use of aspirin: Secondary | ICD-10-CM | POA: Diagnosis not present

## 2018-06-04 DIAGNOSIS — N83202 Unspecified ovarian cyst, left side: Secondary | ICD-10-CM

## 2018-06-04 DIAGNOSIS — K5900 Constipation, unspecified: Secondary | ICD-10-CM | POA: Diagnosis present

## 2018-06-04 DIAGNOSIS — R1032 Left lower quadrant pain: Secondary | ICD-10-CM | POA: Insufficient documentation

## 2018-06-04 DIAGNOSIS — R1030 Lower abdominal pain, unspecified: Secondary | ICD-10-CM | POA: Insufficient documentation

## 2018-06-04 DIAGNOSIS — K5903 Drug induced constipation: Secondary | ICD-10-CM | POA: Diagnosis not present

## 2018-06-04 DIAGNOSIS — R338 Other retention of urine: Secondary | ICD-10-CM

## 2018-06-04 DIAGNOSIS — N83201 Unspecified ovarian cyst, right side: Secondary | ICD-10-CM

## 2018-06-04 LAB — COMPREHENSIVE METABOLIC PANEL
ALK PHOS: 55 U/L (ref 38–126)
ALT: 38 U/L (ref 0–44)
AST: 37 U/L (ref 15–41)
Albumin: 3.2 g/dL — ABNORMAL LOW (ref 3.5–5.0)
Anion gap: 8 (ref 5–15)
BUN: 5 mg/dL — AB (ref 6–20)
CHLORIDE: 108 mmol/L (ref 98–111)
CO2: 23 mmol/L (ref 22–32)
Calcium: 8.6 mg/dL — ABNORMAL LOW (ref 8.9–10.3)
Creatinine, Ser: 0.74 mg/dL (ref 0.44–1.00)
GFR calc Af Amer: >60 mL/min (ref >60)
GFR calc non Af Amer: >60 mL/min (ref >60)
Glucose, Bld: 106 mg/dL — ABNORMAL HIGH (ref 70–99)
Potassium: 3.7 mmol/L (ref 3.5–5.1)
SODIUM: 139 mmol/L (ref 135–145)
Total Bilirubin: 1.2 mg/dL (ref 0.3–1.2)
Total Protein: 6.2 g/dL — ABNORMAL LOW (ref 6.5–8.1)

## 2018-06-04 LAB — CBC WITH DIFFERENTIAL/PLATELET
Abs Immature Granulocytes: 0 10*3/uL (ref 0.0–0.1)
Basophils Absolute: 0 10*3/uL (ref 0.0–0.1)
Basophils Relative: 0 %
EOS ABS: 0.2 10*3/uL (ref 0.0–0.7)
EOS PCT: 2 %
HEMATOCRIT: 34.2 % — AB (ref 36.0–46.0)
Hemoglobin: 10.8 g/dL — ABNORMAL LOW (ref 12.0–15.0)
Immature Granulocytes: 0 %
LYMPHS ABS: 1 10*3/uL (ref 0.7–4.0)
Lymphocytes Relative: 11 %
MCH: 28.5 pg (ref 26.0–34.0)
MCHC: 31.6 g/dL (ref 30.0–36.0)
MCV: 90.2 fL (ref 78.0–100.0)
MONO ABS: 0.5 10*3/uL (ref 0.1–1.0)
MONOS PCT: 5 %
Neutro Abs: 7.1 10*3/uL (ref 1.7–7.7)
Neutrophils Relative %: 82 %
Platelets: 249 10*3/uL (ref 150–400)
RBC: 3.79 MIL/uL — ABNORMAL LOW (ref 3.87–5.11)
RDW: 13.1 % (ref 11.5–15.5)
WBC: 8.7 10*3/uL (ref 4.0–10.5)

## 2018-06-04 LAB — URINALYSIS, ROUTINE W REFLEX MICROSCOPIC
BACTERIA UA: NONE SEEN
BILIRUBIN URINE: NEGATIVE
Glucose, UA: NEGATIVE mg/dL
Ketones, ur: NEGATIVE mg/dL
Leukocytes, UA: NEGATIVE
Nitrite: NEGATIVE
Protein, ur: NEGATIVE mg/dL
Specific Gravity, Urine: 1.004 — ABNORMAL LOW (ref 1.005–1.030)
pH: 6 (ref 5.0–8.0)

## 2018-06-04 LAB — I-STAT BETA HCG BLOOD, ED (MC, WL, AP ONLY): I-stat hCG, quantitative: <5 m[IU]/mL (ref <5)

## 2018-06-04 MED ORDER — FLEET ENEMA 7-19 GM/118ML RE ENEM
1.0000 | ENEMA | Freq: Once | RECTAL | Status: AC
  Administered 2018-06-04: 1 via RECTAL
  Filled 2018-06-04: qty 1

## 2018-06-04 MED ORDER — IOPAMIDOL (ISOVUE-300) INJECTION 61%
100.0000 mL | Freq: Once | INTRAVENOUS | Status: AC | PRN
  Administered 2018-06-04: 100 mL via INTRAVENOUS

## 2018-06-04 MED ORDER — ACETAMINOPHEN 325 MG PO TABS
650.0000 mg | ORAL_TABLET | Freq: Once | ORAL | Status: AC
  Administered 2018-06-04: 650 mg via ORAL
  Filled 2018-06-04: qty 2

## 2018-06-04 MED ORDER — IOPAMIDOL (ISOVUE-300) INJECTION 61%
INTRAVENOUS | Status: AC
  Filled 2018-06-04: qty 100

## 2018-06-04 MED ORDER — SODIUM CHLORIDE 0.9 % IV BOLUS
1000.0000 mL | Freq: Once | INTRAVENOUS | Status: AC
  Administered 2018-06-04: 1000 mL via INTRAVENOUS

## 2018-06-04 NOTE — Discharge Instructions (Addendum)
Please start taking miralax daily.

## 2018-06-04 NOTE — ED Triage Notes (Signed)
Pt here from home with c/o constipation and unable urinate some since surgery , pt was sent home with pain meds
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
## 2018-06-04 NOTE — ED Notes (Signed)
Patient transported to CT 

## 2018-06-04 NOTE — ED Provider Notes (Signed)
Transfer of Care Note I assumed care of Hailey Ferguson on 06/04/2018  Briefly, Hailey Si GaulMaya Ferguson is a 32 y.o. female who:  Recently broke her hip  Here with constipation s/p opioids  Getting an enema  Refused manual disimpaction  Constipation on CT  Difficultly voiding  The plan includes:  Get enema  Voiding trial with urology setup   Please refer to the original provider's note for additional information regarding the care of Hailey Ferguson HospitalMaya Ferguson.  ### Reassessment: I personally reassessed the patient:  Vital Signs:  The most current vitals were  Vitals:   06/04/18 0916 06/04/18 1542  BP: 115/72 105/68  Pulse: 91 (!) 114  Resp: 18 20  Temp: (!) 97.5 F (36.4 C)   SpO2: 100% 98%    Hemodynamics:  The patient is hemodynamically stable. Mental Status:  The patient is Alert  Additional MDM: I reassessed the patient.  She has now had 3 bowel movements in the ED.  She reports feeling better.  She feels safe going home.  I believe that being discharged home with outpatient follow-up is appropriate.  She will go home with a Foley catheter due to possible urinary retention.  She is scheduled for a voiding trial with urology outpatient.  We also encouraged her to begin taking MiraLAX daily.  I provided ED return precautions.    Talitha GivensAshburn, Madason Rauls, MD 06/04/18 1806    Melene PlanFloyd, Dan, DO 06/04/18 1810    Melene PlanFloyd, Dan, DO 06/04/18 [phone]

## 2018-06-04 NOTE — ED Notes (Signed)
Leg bag placed on pt , showed how to use the bag , pt verbalized understanding

## 2018-06-30 NOTE — Pre-Procedure Instructions (Signed)
Hailey Ferguson  06/30/2018      [facility]- [place], [place] - [id number] [place] [address] Phone: [phone] Fax: [phone]    Your procedure is scheduled on July 07, 2018.  [facility] AM.  Call this number if you have problems the morning of surgery:  [phone]   Remember:  Do not eat or drink after midnight.    Take these medicines the morning of surgery with A SIP OF WATER  Methocarbamol (robaxin) Ondansetron (zofran)-if needed for nausea Oxycodone-acetaminophen (percocet)-if needed  Follow your surgeon's instructions on when to hold/resume aspirin.  If no instructions were given call the office to determine how they would like to you take aspirin  7 days prior to surgery STOP taking any Aleve, Naproxen, Ibuprofen, Motrin, Advil, Goody's, BC's, all herbal medications, fish oil, and all vitamins    Do not wear jewelry, make-up or nail polish.  Do not wear lotions, powders, or perfumes, or deodorant.  Do not shave 48 hours prior to surgery.    Do not bring valuables to the hospital.  [facility] is not responsible for any belongings or valuables.  Contacts, dentures or bridgework may not be worn into surgery.  Leave your suitcase in the car.  After surgery it may be brought to your room.  For patients admitted to the hospital, discharge time will be determined by your treatment team.  Patients discharged the day of surgery will not be allowed to drive home.    [facility]- Preparing For Surgery  Before surgery, you can play an important role. Because skin is not sterile, your skin needs to be as free of germs as possible. You can reduce the number of germs on your skin by washing with CHG (chlorahexidine gluconate) Soap before surgery.  CHG is an antiseptic cleaner which kills germs and bonds with the skin to continue killing germs even after washing.    Oral  Hygiene is also important to reduce your risk of infection.  Remember - BRUSH YOUR TEETH THE MORNING OF SURGERY WITH YOUR REGULAR TOOTHPASTE  Please do not use if you have an allergy to CHG or antibacterial soaps. If your skin becomes reddened/irritated stop using the CHG.  Do not shave (including legs and underarms) for at least 48 hours prior to first CHG shower. It is OK to shave your face.  Please follow these instructions carefully.   1. Shower the NIGHT BEFORE SURGERY and the MORNING OF SURGERY with CHG.   2. If you chose to wash your hair, wash your hair first as usual with your normal shampoo.  3. After you shampoo, rinse your hair and body thoroughly to remove the shampoo.  4. Use CHG as you would any other liquid soap. You can apply CHG directly to the skin and wash gently with a scrungie or a clean washcloth.   5. Apply the CHG Soap to your body ONLY FROM THE NECK DOWN.  Do not use on open wounds or open sores. Avoid contact with your eyes, ears, mouth and genitals (private parts). Wash Face and genitals (private parts)  with your normal soap.  6. Wash thoroughly, paying special attention to the area where your surgery will be performed.  7. Thoroughly rinse your body with warm water from the neck down.  8. DO NOT shower/wash with your normal soap after using and rinsing off the CHG Soap.  9.  Pat yourself dry with a CLEAN TOWEL.  10. Wear CLEAN PAJAMAS to bed the night before surgery, wear comfortable clothes the morning of surgery  11. Place CLEAN SHEETS on your bed the night of your first shower and DO NOT SLEEP WITH PETS.  Day of Surgery:  Do not apply any deodorants/lotions.  Please wear clean clothes to the hospital/surgery center.   Remember to brush your teeth WITH YOUR REGULAR TOOTHPASTE.  Please read over the fact sheets that you were given.

## 2018-06-30 NOTE — Progress Notes (Addendum)
PCP: pt denies  Cardiologist: pt denies  EKG: pt denies past year  Stress test: pt denies  ECHO: pt denies  Cardiac Cath: pt deniss  Chest x-ray: denies past year  Napali translator requested for day of surgery.  Patient's brother Hailey Ferguson interpreted today an will be with patient day of surgery.     Patient advised to call Dr. William Hamburgeroger's office to determine when he would like her to begin holding aspirin 325 mg.  Chart given to anesthesia for follow up

## 2018-07-01 ENCOUNTER — Encounter (HOSPITAL_COMMUNITY): Payer: Self-pay

## 2018-07-01 ENCOUNTER — Other Ambulatory Visit: Payer: Self-pay

## 2018-07-01 ENCOUNTER — Encounter (HOSPITAL_COMMUNITY)
Admission: RE | Admit: 2018-07-01 | Discharge: 2018-07-01 | Disposition: A | Discharge status: Home / Self Care | Payer: PRIVATE HEALTH INSURANCE | Source: Ambulatory Visit | Attending: Orthopedic Surgery | Admitting: Orthopedic Surgery

## 2018-07-01 DIAGNOSIS — Z01812 Encounter for preprocedural laboratory examination: Secondary | ICD-10-CM | POA: Diagnosis not present

## 2018-07-01 LAB — CBC
HCT: 38.2 % (ref 36.0–46.0)
HEMOGLOBIN: 12.1 g/dL (ref 12.0–15.0)
MCH: 28.7 pg (ref 26.0–34.0)
MCHC: 31.7 g/dL (ref 30.0–36.0)
MCV: 90.5 fL (ref 78.0–100.0)
PLATELETS: 232 10*3/uL (ref 150–400)
RBC: 4.22 MIL/uL (ref 3.87–5.11)
RDW: 12.8 % (ref 11.5–15.5)
WBC: 4.4 10*3/uL (ref 4.0–10.5)

## 2018-07-06 NOTE — Anesthesia Preprocedure Evaluation (Addendum)
Anesthesia Evaluation  Patient identified by MRN, date of birth, ID band Patient awake    Reviewed: Allergy & Precautions, NPO status , Patient's Chart, lab work & pertinent test results  Airway Mallampati: II  TM Distance: >3 FB Neck ROM: Full    Dental no notable dental hx. (+) Teeth Intact, Dental Advisory Given   Pulmonary neg pulmonary ROS,    Pulmonary exam normal breath sounds clear to auscultation       Cardiovascular Exercise Tolerance: Good negative cardio ROS Normal cardiovascular exam Rhythm:Regular Rate:Normal     Neuro/Psych negative neurological ROS  negative psych ROS   GI/Hepatic negative GI ROS,   Endo/Other    Renal/GU   negative genitourinary   Musculoskeletal   Abdominal   Peds negative pediatric ROS (+)  Hematology   Anesthesia Other Findings   Reproductive/Obstetrics negative OB ROS                            Anesthesia Physical Anesthesia Plan  ASA: II  Anesthesia Plan: General   Post-op Pain Management:    Induction: Intravenous  PONV Risk Score and Plan: 3 and Treatment may vary due to age or medical condition, Dexamethasone, Ondansetron and Midazolam  Airway Management Planned: Oral ETT  Additional Equipment:   Intra-op Plan:   Post-operative Plan: Extubation in OR  Informed Consent: I have reviewed the patients History and Physical, chart, labs and discussed the procedure including the risks, benefits and alternatives for the proposed anesthesia with the patient or authorized representative who has indicated his/her understanding and acceptance.   Dental advisory given  Plan Discussed with: CRNA  Anesthesia Plan Comments:         Anesthesia Quick Evaluation

## 2018-07-07 ENCOUNTER — Ambulatory Visit (HOSPITAL_COMMUNITY): Payer: No Typology Code available for payment source | Admitting: Anesthesiology

## 2018-07-07 ENCOUNTER — Ambulatory Visit (HOSPITAL_COMMUNITY): Payer: No Typology Code available for payment source | Admitting: Physician Assistant

## 2018-07-07 ENCOUNTER — Observation Stay (HOSPITAL_COMMUNITY)
Admission: RE | Admit: 2018-07-07 | Discharge: 2018-07-08 | Disposition: A | Discharge status: Home / Self Care | Payer: No Typology Code available for payment source | Source: Ambulatory Visit | Attending: Orthopedic Surgery | Admitting: Orthopedic Surgery

## 2018-07-07 ENCOUNTER — Encounter (HOSPITAL_COMMUNITY): Payer: Self-pay | Admitting: Anesthesiology

## 2018-07-07 ENCOUNTER — Encounter (HOSPITAL_COMMUNITY)
Admission: RE | Disposition: A | Discharge status: Home / Self Care | Payer: Self-pay | Source: Ambulatory Visit | Attending: Orthopedic Surgery

## 2018-07-07 ENCOUNTER — Ambulatory Visit (HOSPITAL_COMMUNITY): Payer: No Typology Code available for payment source

## 2018-07-07 ENCOUNTER — Other Ambulatory Visit: Payer: Self-pay

## 2018-07-07 DIAGNOSIS — M24052 Loose body in left hip: Secondary | ICD-10-CM | POA: Diagnosis not present

## 2018-07-07 DIAGNOSIS — M1612 Unilateral primary osteoarthritis, left hip: Secondary | ICD-10-CM

## 2018-07-07 DIAGNOSIS — Z79899 Other long term (current) drug therapy: Secondary | ICD-10-CM | POA: Insufficient documentation

## 2018-07-07 DIAGNOSIS — S73192A Other sprain of left hip, initial encounter: Principal | ICD-10-CM | POA: Insufficient documentation

## 2018-07-07 DIAGNOSIS — S73199A Other sprain of unspecified hip, initial encounter: Secondary | ICD-10-CM

## 2018-07-07 DIAGNOSIS — X58XXXA Exposure to other specified factors, initial encounter: Secondary | ICD-10-CM | POA: Insufficient documentation

## 2018-07-07 DIAGNOSIS — Z7982 Long term (current) use of aspirin: Secondary | ICD-10-CM | POA: Diagnosis not present

## 2018-07-07 DIAGNOSIS — M94252 Chondromalacia, left hip: Secondary | ICD-10-CM | POA: Insufficient documentation

## 2018-07-07 DIAGNOSIS — Z419 Encounter for procedure for purposes other than remedying health state, unspecified: Secondary | ICD-10-CM

## 2018-07-07 DIAGNOSIS — R531 Weakness: Secondary | ICD-10-CM | POA: Insufficient documentation

## 2018-07-07 HISTORY — PX: HIP ARTHROSCOPY: SHX668

## 2018-07-07 HISTORY — DX: Other sprain of unspecified hip, initial encounter: S73.199A

## 2018-07-07 HISTORY — DX: Loose body in left hip: M24.052

## 2018-07-07 LAB — POCT PREGNANCY, URINE: PREG TEST UR: NEGATIVE

## 2018-07-07 SURGERY — ARTHROSCOPY HIP
Anesthesia: General | Site: Hip | Laterality: Left

## 2018-07-07 MED ORDER — DOCUSATE SODIUM 100 MG PO CAPS
100.0000 mg | ORAL_CAPSULE | Freq: Two times a day (BID) | ORAL | Status: DC
  Administered 2018-07-07 – 2018-07-08 (×2): 100 mg via ORAL
  Filled 2018-07-07 (×2): qty 1

## 2018-07-07 MED ORDER — FENTANYL CITRATE (PF) 250 MCG/5ML IJ SOLN
INTRAMUSCULAR | Status: AC
  Filled 2018-07-07: qty 5

## 2018-07-07 MED ORDER — OXYCODONE HCL 5 MG PO TABS: 5.0000 mg | ORAL_TABLET | ORAL | Status: DC | PRN

## 2018-07-07 MED ORDER — PROMETHAZINE HCL 25 MG/ML IJ SOLN: 6.2500 mg | INTRAMUSCULAR | Status: DC | PRN

## 2018-07-07 MED ORDER — HYDROCODONE-ACETAMINOPHEN 7.5-325 MG PO TABS
ORAL_TABLET | ORAL | Status: AC
  Filled 2018-07-07: qty 1

## 2018-07-07 MED ORDER — DICLOFENAC SODIUM 75 MG PO TBEC: 75.0000 mg | DELAYED_RELEASE_TABLET | Freq: Two times a day (BID) | ORAL | 0 refills | Status: AC

## 2018-07-07 MED ORDER — HYDROMORPHONE HCL 1 MG/ML IJ SOLN
INTRAMUSCULAR | Status: AC
  Filled 2018-07-07: qty 1

## 2018-07-07 MED ORDER — ACETAMINOPHEN 10 MG/ML IV SOLN: 1000.0000 mg | Freq: Once | INTRAVENOUS | Status: DC | PRN

## 2018-07-07 MED ORDER — ACETAMINOPHEN 650 MG RE SUPP: 650.0000 mg | Freq: Four times a day (QID) | RECTAL | Status: DC | PRN

## 2018-07-07 MED ORDER — DEXAMETHASONE SODIUM PHOSPHATE 10 MG/ML IJ SOLN
INTRAMUSCULAR | Status: DC | PRN
  Administered 2018-07-07: 10 mg via INTRAVENOUS

## 2018-07-07 MED ORDER — MEPERIDINE HCL 50 MG/ML IJ SOLN: 6.2500 mg | INTRAMUSCULAR | Status: DC | PRN

## 2018-07-07 MED ORDER — SODIUM CHLORIDE 0.9 % IV SOLN
INTRAVENOUS | Status: DC | PRN
  Administered 2018-07-07: 50 ug/min via INTRAVENOUS

## 2018-07-07 MED ORDER — ONDANSETRON HCL 4 MG/2ML IJ SOLN: 4.0000 mg | Freq: Four times a day (QID) | INTRAMUSCULAR | Status: DC | PRN

## 2018-07-07 MED ORDER — METHOCARBAMOL 1000 MG/10ML IJ SOLN
500.0000 mg | Freq: Four times a day (QID) | INTRAVENOUS | Status: DC | PRN
  Filled 2018-07-07: qty 5

## 2018-07-07 MED ORDER — DIPHENHYDRAMINE HCL 12.5 MG/5ML PO ELIX: 12.5000 mg | ORAL_SOLUTION | ORAL | Status: DC | PRN

## 2018-07-07 MED ORDER — LACTATED RINGERS IV SOLN
INTRAVENOUS | Status: DC
  Administered 2018-07-07: 11:00:00 via INTRAVENOUS

## 2018-07-07 MED ORDER — HYDROMORPHONE HCL 1 MG/ML IJ SOLN
0.2500 mg | INTRAMUSCULAR | Status: DC | PRN
  Administered 2018-07-07: 0.5 mg via INTRAVENOUS

## 2018-07-07 MED ORDER — BUPIVACAINE-EPINEPHRINE (PF) 0.25% -1:200000 IJ SOLN
INTRAMUSCULAR | Status: AC
  Filled 2018-07-07: qty 30

## 2018-07-07 MED ORDER — FENTANYL CITRATE (PF) 250 MCG/5ML IJ SOLN
INTRAMUSCULAR | Status: DC | PRN
  Administered 2018-07-07 (×3): 25 ug via INTRAVENOUS
  Administered 2018-07-07: 50 ug via INTRAVENOUS
  Administered 2018-07-07 (×2): 25 ug via INTRAVENOUS

## 2018-07-07 MED ORDER — HYDROCODONE-ACETAMINOPHEN 7.5-325 MG PO TABS
1.0000 | ORAL_TABLET | Freq: Once | ORAL | Status: AC | PRN
  Administered 2018-07-07: 1 via ORAL

## 2018-07-07 MED ORDER — MIDAZOLAM HCL 2 MG/2ML IJ SOLN
INTRAMUSCULAR | Status: AC
  Filled 2018-07-07: qty 2

## 2018-07-07 MED ORDER — CHLORHEXIDINE GLUCONATE 4 % EX LIQD: 60.0000 mL | Freq: Once | CUTANEOUS | Status: DC

## 2018-07-07 MED ORDER — CEFAZOLIN SODIUM-DEXTROSE 2-4 GM/100ML-% IV SOLN
INTRAVENOUS | Status: AC
  Filled 2018-07-07: qty 100

## 2018-07-07 MED ORDER — METHOCARBAMOL 500 MG PO TABS
500.0000 mg | ORAL_TABLET | Freq: Four times a day (QID) | ORAL | Status: DC | PRN
  Administered 2018-07-07 – 2018-07-08 (×2): 500 mg via ORAL
  Filled 2018-07-07 (×2): qty 1

## 2018-07-07 MED ORDER — 0.9 % SODIUM CHLORIDE (POUR BTL) OPTIME
TOPICAL | Status: DC | PRN
  Administered 2018-07-07: 1000 mL

## 2018-07-07 MED ORDER — ASPIRIN 325 MG PO TABS
325.0000 mg | ORAL_TABLET | Freq: Every day | ORAL | Status: DC
  Administered 2018-07-07 – 2018-07-08 (×2): 325 mg via ORAL
  Filled 2018-07-07 (×2): qty 1

## 2018-07-07 MED ORDER — ONDANSETRON HCL 4 MG/2ML IJ SOLN
INTRAMUSCULAR | Status: DC | PRN
  Administered 2018-07-07: 4 mg via INTRAVENOUS

## 2018-07-07 MED ORDER — ONDANSETRON HCL 4 MG PO TABS: 4.0000 mg | ORAL_TABLET | Freq: Four times a day (QID) | ORAL | Status: DC | PRN

## 2018-07-07 MED ORDER — SODIUM CHLORIDE 0.9 % IR SOLN
Status: DC | PRN
  Administered 2018-07-07: 3000 mL

## 2018-07-07 MED ORDER — HYDROCODONE-ACETAMINOPHEN 5-325 MG PO TABS: 1.0000 | ORAL_TABLET | ORAL | 0 refills | Status: DC | PRN

## 2018-07-07 MED ORDER — ONDANSETRON HCL 4 MG PO TABS: 4.0000 mg | ORAL_TABLET | Freq: Three times a day (TID) | ORAL | 0 refills | Status: DC | PRN

## 2018-07-07 MED ORDER — MORPHINE SULFATE (PF) 2 MG/ML IV SOLN: 2.0000 mg | INTRAVENOUS | Status: DC | PRN

## 2018-07-07 MED ORDER — LIDOCAINE 2% (20 MG/ML) 5 ML SYRINGE
INTRAMUSCULAR | Status: DC | PRN
  Administered 2018-07-07: 100 mg via INTRAVENOUS

## 2018-07-07 MED ORDER — RANITIDINE HCL 150 MG PO TABS: 150.0000 mg | ORAL_TABLET | Freq: Two times a day (BID) | ORAL | 0 refills | Status: DC

## 2018-07-07 MED ORDER — BUPIVACAINE-EPINEPHRINE 0.25% -1:200000 IJ SOLN
INTRAMUSCULAR | Status: DC | PRN
  Administered 2018-07-07: 30 mL

## 2018-07-07 MED ORDER — CEFAZOLIN SODIUM-DEXTROSE 2-4 GM/100ML-% IV SOLN
2.0000 g | INTRAVENOUS | Status: AC
  Administered 2018-07-07: 2 g via INTRAVENOUS

## 2018-07-07 MED ORDER — PROPOFOL 10 MG/ML IV BOLUS
INTRAVENOUS | Status: DC | PRN
  Administered 2018-07-07: 200 mg via INTRAVENOUS

## 2018-07-07 MED ORDER — KETOROLAC TROMETHAMINE 15 MG/ML IJ SOLN
15.0000 mg | Freq: Four times a day (QID) | INTRAMUSCULAR | Status: DC
  Administered 2018-07-07 – 2018-07-08 (×3): 15 mg via INTRAVENOUS
  Filled 2018-07-07 (×3): qty 1

## 2018-07-07 MED ORDER — ACETAMINOPHEN 325 MG PO TABS: 650.0000 mg | ORAL_TABLET | Freq: Four times a day (QID) | ORAL | Status: DC | PRN

## 2018-07-07 SURGICAL SUPPLY — 33 items
BLADE EXCALIBUR CVD 4.2X19 (INSTRUMENTS) ×2 IMPLANT
BLADE SURG 11 STRL SS (BLADE) ×2 IMPLANT
COVER MAYO STAND STRL (DRAPES) ×2 IMPLANT
DISSECTOR 4.2MMX19CM HL (MISCELLANEOUS) ×2 IMPLANT
DRAPE STERI IOBAN 125X83 (DRAPES) ×2 IMPLANT
DRSG EMULSION OIL 3X3 NADH (GAUZE/BANDAGES/DRESSINGS) ×2 IMPLANT
DRSG PAD ABDOMINAL 8X10 ST (GAUZE/BANDAGES/DRESSINGS) ×2 IMPLANT
DRSG TEGADERM 4X4.75 (GAUZE/BANDAGES/DRESSINGS) ×2 IMPLANT
GAUZE SPONGE 4X4 12PLY STRL (GAUZE/BANDAGES/DRESSINGS) ×2 IMPLANT
GOWN STRL REUS W/ TWL LRG LVL3 (GOWN DISPOSABLE) ×2 IMPLANT
GOWN STRL REUS W/ TWL XL LVL3 (GOWN DISPOSABLE) ×1 IMPLANT
GOWN STRL REUS W/TWL LRG LVL3 (GOWN DISPOSABLE) ×2
GOWN STRL REUS W/TWL XL LVL3 (GOWN DISPOSABLE) ×1
KIT HIP ARTHROSCOPY (SET/KITS/TRAYS/PACK) ×2 IMPLANT
KIT TURNOVER KIT B (KITS) ×2 IMPLANT
MANIFOLD NEPTUNE II (INSTRUMENTS) ×2 IMPLANT
PACK SURGICAL SETUP 50X90 (CUSTOM PROCEDURE TRAY) ×2 IMPLANT
PAD ARMBOARD 7.5X6 YLW CONV (MISCELLANEOUS) ×4 IMPLANT
SET ARTHROSCOPY TUBING (MISCELLANEOUS) ×1
SET ARTHROSCOPY TUBING LN (MISCELLANEOUS) ×1 IMPLANT
SET HIP ARTHROSCOPE LOANER (INSTRUMENTS) ×4 IMPLANT
SET HIP ARTHROSCOPE MST LOANER (INSTRUMENTS) ×2 IMPLANT
SPONGE LAP 18X18 X RAY DECT (DISPOSABLE) ×2 IMPLANT
SUT ETHILON 4 0 PS 2 18 (SUTURE) ×2 IMPLANT
TAPE CLOTH 4X10 WHT NS (GAUZE/BANDAGES/DRESSINGS) ×2 IMPLANT
TOWEL OR 17X24 6PK STRL BLUE (TOWEL DISPOSABLE) ×2 IMPLANT
TOWEL OR 17X26 10 PK STRL BLUE (TOWEL DISPOSABLE) ×2 IMPLANT
TUBE CONNECTING 12X1/4 (SUCTIONS) ×2 IMPLANT
TUBING ARTHROSCOPY IRRIG 16FT (MISCELLANEOUS) ×2 IMPLANT
WAND 50 DEG COVAC W/CORD (SURGICAL WAND) IMPLANT
WAND HAND CNTRL MULTIVAC 90 (MISCELLANEOUS) IMPLANT
WATER STERILE IRR 1000ML POUR (IV SOLUTION) ×2 IMPLANT
loaner shipping fee IMPLANT

## 2018-07-07 NOTE — H&P (Signed)
ORTHOPAEDIC H and P  REQUESTING PHYSICIAN: Nicholes Stairs, MD  PCP:  Patient, No Pcp Per  Chief Complaint: Left hip loose body  HPI: Hailey Ferguson is a 32 y.o. female who complains of left hip pain and stiffness following a posterior traumatic left hip dislocation.  During the course of that hospitalization she was noted on postreduction CT scan to have a retained bony fragment within the left hip joint.  She has no arthritis and no antecedent hip pain therefore the recommendation was for arthroscopic retrieval of that loose body.  She presents today for that surgery.  We have met previously in the office and discussed this procedure at length.  All questions have been previously answered.  She has provided informed consent prior to today's appointment.  Past Medical History:  Diagnosis Date  . Medical history non-contributory    Past Surgical History:  Procedure Laterality Date  . HIP CLOSED REDUCTION Left 05/29/2018   Procedure: CLOSED REDUCTION HIP;  Surgeon: Shona Needles, MD;  Location: [facility];  Service: Orthopedics;  Laterality: Left;   Social History   Socioeconomic History  . Marital status: Married    Spouse name: Not on file  . Number of children: Not on file  . Years of education: Not on file  . Highest education level: Not on file  Occupational History  . Not on file  Social Needs  . Financial resource strain: Not on file  . Food insecurity:    Worry: Not on file    Inability: Not on file  . Transportation needs:    Medical: Not on file    Non-medical: Not on file  Tobacco Use  . Smoking status: Never Smoker  . Smokeless tobacco: Never Used  Substance and Sexual Activity  . Alcohol use: No  . Drug use: No  . Sexual activity: Yes  Lifestyle  . Physical activity:    Days per week: Not on file    Minutes per session: Not on file  . Stress: Not on file  Relationships  . Social connections:    Talks on phone: Not on file    Gets together: Not  on file    Attends religious service: Not on file    Active member of club or organization: Not on file    Attends meetings of clubs or organizations: Not on file    Relationship status: Not on file  Other Topics Concern  . Not on file  Social History Narrative  . Not on file   History reviewed. No pertinent family history. No Known Allergies Prior to Admission medications   Medication Sig Start Date End Date Taking? Authorizing Provider  aspirin 325 MG tablet Take 1 tablet (325 mg total) by mouth daily. 05/31/18  Yes Haddix, Thomasene Lot, MD  methocarbamol (ROBAXIN-750) 750 MG tablet Take 1 tablet (750 mg total) by mouth every 6 (six) hours as needed for muscle spasms. Patient taking differently: Take 750 mg by mouth 3 (three) times daily.  05/30/18  Yes Haddix, Thomasene Lot, MD  ondansetron (ZOFRAN) 4 MG tablet Take 4 mg by mouth every 8 (eight) hours as needed for nausea/vomiting. 06/10/18  Yes [provider]  oxyCODONE-acetaminophen (PERCOCET/ROXICET) 5-325 MG tablet Take 1 tablet by mouth every 4 (four) hours as needed for moderate pain. Patient taking differently: Take 1 tablet by mouth every 6 (six) hours as needed for moderate pain.  05/30/18  Yes Haddix, Thomasene Lot, MD  polyethylene glycol (MIRALAX / GLYCOLAX)  packet Take 17 g by mouth daily.   Yes [provider]   No results found.  Positive ROS: All other systems have been reviewed and were otherwise negative with the exception of those mentioned in the HPI and as above.  Physical Exam: General: Alert, no acute distress Cardiovascular: No pedal edema Respiratory: No cyanosis, no use of accessory musculature GI: No organomegaly, abdomen is soft and non-tender Skin: No lesions in the area of chief complaint Neurologic: Sensation intact distally Psychiatric: Patient is competent for consent with normal mood and affect Lymphatic: No axillary or cervical lymphadenopathy    Assessment: 1.  Traumatic left hip posterior  dislocation status post reduction 2.  Retained loose body within left intra-articular hip  Plan: -Today's plan is for arthroscopic removal of loose bony fragment within left hip.  We again reviewed the risk, benefits, and indications of this procedure at length.  Risks include but are not limited to bleeding, infection, damage to surrounding neurovascular structures, development of arthrosis, persistent pain, dislocation, need for further surgery, development of blood clots, and the risk of anesthesia.  She has provided informed consent. -She will discharge home from PACU with touchdown weightbearing to the left lower extremity with crutch assistance.  She will begin physical therapy immediately postoperatively.    Nicholes Stairs, MD Cell [phone]    07/07/2018 11:02 AM

## 2018-07-07 NOTE — Discharge Instructions (Signed)
Touchdown weightbearing only to left lower extremity.  Use crutches for ambulation. -For mild to moderate pain use Tylenol and/or Advil as needed.  For breakthrough pain use Norco as directed. -Maintain your postoperative bandages until your follow-up appointment.  You may shower with these in place but do not submerge underwater. -He will begin physical therapy in the next 1 to 2 weeks for routine postoperative care and recovery. -For the prevention of bony overgrowth take diclofenac 75 mg as directed for the next 2 weeks.  Once you have completed those prescriptions use aspirin once daily for 4 weeks for the prevention of blood clots. -While taking the Diclofenac take Zantac twice daily for the same 2 weeks. -Return to see Dr. Aundria Rudogers in 2 weeks.

## 2018-07-07 NOTE — Brief Op Note (Signed)
07/07/2018  12:50 PM  PATIENT:  Hailey Ferguson  32 y.o. female  PRE-OPERATIVE DIAGNOSIS:  Left hip loose body  POST-OPERATIVE DIAGNOSIS:  Left hip loose body Left hip anterior superior labrum tear  PROCEDURE:  Procedure(s) with comments: Left hip arthroscopic loose body removal  Left hip labrum debridement, arthroscopic (Left) - 120 mins  SURGEON:  Surgeon(s) and Role:    * Aundria Rudogers, Noah DelaineJason Patrick, MD - Primary   ASSISTANTS:  Hart CarwinJustin Queen, RNFA   ANESTHESIA:   general  EBL:  10 mL   BLOOD ADMINISTERED:none  DRAINS: none   LOCAL MEDICATIONS USED:  MARCAINE     SPECIMEN:  No Specimen  DISPOSITION OF SPECIMEN:  N/A  COUNTS:  YES  TOURNIQUET:  * No tourniquets in log *  DICTATION: .Note written in EPIC  PLAN OF CARE: Discharge to home after PACU  PATIENT DISPOSITION:  PACU - hemodynamically stable.   Delay start of Pharmacological VTE agent (>24hrs) due to surgical blood loss or risk of bleeding: not applicable

## 2018-07-07 NOTE — Transfer of Care (Signed)
Immediate Anesthesia Transfer of Care Note  Patient: Hailey Ferguson  Procedure(s) Performed: Left hip arthroscopic loose body removal (Left Hip)  Patient Location: PACU  Anesthesia Type:General  Level of Consciousness: awake, alert  and oriented  Airway & Oxygen Therapy: Patient Spontanous Breathing  Post-op Assessment: Report given to RN and Post -op Vital signs reviewed and stable  Post vital signs: Reviewed and stable  Last Vitals:  Vitals Value Taken Time  BP 140/126 07/07/2018 12:58 PM  Temp    Pulse 97 07/07/2018 12:59 PM  Resp 20 07/07/2018 12:59 PM  SpO2 98 % 07/07/2018 12:59 PM  Vitals shown include unvalidated device data.  Last Pain:  Vitals:   07/07/18 1057  TempSrc:   PainSc: 4       Patients Stated Pain Goal: 2 (07/07/18 1057)  Complications: No apparent anesthesia complications

## 2018-07-07 NOTE — Progress Notes (Signed)
Patient attempted to get up and use crutches with Orthopedic Tech, unable to stand or take any steps with crutches, even with multiple assist.  Family at bedside and is concerned that patient is not ready to go home today.  They would like her to be admitted overnight.  Dr. Aundria Rudogers was notified and will write orders for admission.

## 2018-07-07 NOTE — OR Nursing (Signed)
Late entry at 1820 by Timoteo Expose. Remer Couse, RN to correct supply charges.

## 2018-07-07 NOTE — Anesthesia Postprocedure Evaluation (Signed)
Anesthesia Post Note  Patient: Hailey Ferguson  Procedure(s) Performed: Left hip arthroscopic loose body removal (Left Hip)     Patient location during evaluation: PACU Anesthesia Type: General Level of consciousness: awake and alert Pain management: pain level controlled Vital Signs Assessment: post-procedure vital signs reviewed and stable Respiratory status: spontaneous breathing, nonlabored ventilation, respiratory function stable and patient connected to nasal cannula oxygen Cardiovascular status: blood pressure returned to baseline and stable Postop Assessment: no apparent nausea or vomiting Anesthetic complications: no    Last Vitals:  Vitals:   07/07/18 1415 07/07/18 1430  BP: 108/73 115/79  Pulse: 88 (!) 110  Resp: 18 16  Temp:    SpO2: 98% 98%    Last Pain:  Vitals:   07/07/18 1431  TempSrc:   PainSc: 7                  Trevor IhaStephen A Houser

## 2018-07-07 NOTE — Op Note (Signed)
Date of Surgery: 07/07/2018  INDICATIONS: Ms. Hailey Ferguson is a 32 y.o.-year-old female with a History of traumatic left hip dislocation posteriorly.  This was managed in a closed fashion.  Postreduction CT scan indicated a loose osteochondral fragment within the weightbearing surface of the hip joint.  She is indicated for arthroscopic management and loose body removal with debridements as indicated.  We have previous the discussed this procedure at length in the office prior to today's surgical appointment.;  The patient did consent to the procedure after discussion of the risks and benefits.  PREOPERATIVE DIAGNOSIS: 1.  Loose body left hip joint  POSTOPERATIVE DIAGNOSIS: 1. Loose body left hip joint. 2.  Left hip anterior superior labrum tear 3.  Left hip chondromalacia of femoral head  PROCEDURE:  1. Left hip arthroscopic retrieval of loose body 2. Left hip arthroscopic debridement of labrum tear  SURGEON: Maryan RuedJason P Everett Ehrler, M.D.  ASSIST: Hart CarwinJustin Queen, RNFA.Marland Kitchen.  ANESTHESIA:  general  IV FLUIDS AND URINE: See anesthesia.  ESTIMATED BLOOD LOSS: 10 mL.  IMPLANTS: None  DRAINS: None   COMPLICATIONS: None.  DESCRIPTION OF PROCEDURE: The patient was brought to the operating room and [facility].  The patient had been signed prior to the procedure and this was documented. The patient had the anesthesia placed by the anesthesiologist.  A time-out was performed to confirm that this was the correct patient, site, side and location. The patient did receive antibiotics prior to the incision and was re-dosed during the procedure as needed at indicated intervals.  The operative extremity was placed in slight flexion and internal rotation and longitudinal traction was applied.  Fluoroscopic images were obtained to assure appropriate distraction of the hip joint to allow for instrumentation.  The patient had the operative extremity prepped and draped in the standard surgical  fashion.     We initiated the hip arthroscopy with standard anterior lateral as well as mid anterior working portals.  These were established via spinal needle localization with the help of fluoroscopy.  We began with the anterior lateral portal 1 cm proximal And 1 Centimeter anterior to the tip of the greater trochanter.  We introduced a spinal needle into the joint.  A gas arthrography phenomenon was observed under direct fluoroscopic visualization with withdrawal of the spinal neater obturator.  We next dilated this oral site and introduced the arthroscope.  Next we visualized the anterior triangle tween the capsule and anterior superior labrum and under direct visualization a sensory portal was established.  This mid anterior working portal was established proximally 7 cm distal and anterior to our anterior lateral portal.  This was at a 45 trajectory.  Again utilizing direct visualization and fluoroscopic assistance spine needle localization was established.  We then dilated in similar fashion to our viewing portal and a working cannula was placed.   We then lavaged the joint copiously as there was significant hematoma and hemarthrosis in the joint from previous to manage dislocation.  On diagnostic arthroscopy there was noted to be no arthritis or chondral lesions of the acetabulum.  On the femoral head there was an anterior grade 4 lesion of approximate 1 cm x 5 mm.  This did have stable edges.  There was a loose osteochondral fragment noted within the hip joint.  This was embedded in a organized hematoma.  On the diagnostic portion of the acid tabular there was noted to be an anterior superior labral tear with free edge degeneration and a flap  that freely flipped in between the femoral acetabular joint.  On probing there was minimal detachment at the chondral labral interface.  This was stable to probe.  The posterior inferior labrum was intact.  There was abundant synovitis noted along the  capsule.   Next we moved to the excise the osteochondral fragment.  An intraportal capsulotomy was made at the anterior lateral portal to allow for freedom of motion of our instruments.  We switched the arthroscope into the mid anterior portal and worked through the anterior lateral portal.  Utilizing the [facility] blade we freed up the osteochondral fragment which measured 1 cm x 1 cm.  This was excised with the motorized shaver.     We then turned our attention to the anterior superior labrum tear.  The arthroscope was transferred back into the anterior lateral working portal.  The motorized shaver was then reduced into the mid anterior portal.  The labral flap was debrided with the motorized shaver and remaining labral remnant was debrided to a stable rim.   Pictures were taken throughout the case and the arthroscope and instruments were then removed from the hip joint after the femoral traction was removed.  Total time under traction was 46 minutes.   The portal sites were closed with interrupted 3-0 nylon sutures in a standard sterile bandage was applied.  The patient was awoken from general anesthesia with no complications.  The counts were correct at the conclusion of the case and the patient was transferred to PACU in stable condition.     POSTOPERATIVE PLAN:  The patient will be touchdown weightbearing for 3 weeks postoperatively.  She will be on twice daily aspirin for 4 weeks following a 2 week period of 75 mg diclofenac Heterotropic ossification prophylaxis.  While on the diclofenac she will also take Zantac twice daily.  She will receive physical therapy here in the hospital before being discharged home tomorrow after an observation stay.  I will see her back in the office in 2 weeks per she will get outpatient physical therapy medially following her discharge home.

## 2018-07-07 NOTE — Anesthesia Procedure Notes (Signed)
Procedure Name: LMA Insertion Date/Time: 07/07/2018 11:31 AM Performed by: Hart RobinsonsByrd, Andrena Margerum R, CRNA Pre-anesthesia Checklist: Patient identified, Emergency Drugs available, Suction available and Patient being monitored Patient Re-evaluated:Patient Re-evaluated prior to induction Oxygen Delivery Method: Circle System Utilized Preoxygenation: Pre-oxygenation with 100% oxygen Induction Type: IV induction Ventilation: Mask ventilation without difficulty LMA: LMA inserted LMA Size: 4.0 Number of attempts: 1 Airway Equipment and Method: Bite block Placement Confirmation: positive ETCO2 Tube secured with: Tape Dental Injury: Teeth and Oropharynx as per pre-operative assessment

## 2018-07-07 NOTE — Progress Notes (Signed)
Pt roomed to unit at 1830hrs.very drowsy but opens eyes to voice

## 2018-07-08 DIAGNOSIS — S73192A Other sprain of left hip, initial encounter: Secondary | ICD-10-CM | POA: Diagnosis not present

## 2018-07-08 LAB — HIV ANTIBODY (ROUTINE TESTING W REFLEX): HIV SCREEN 4TH GENERATION: NONREACTIVE
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
## 2018-07-08 NOTE — Discharge Summary (Signed)
Patient ID: Hailey Ferguson MRN: [id number] DOB/AGE: [birth year] 32 y.o.  Admit date: 07/07/2018 Discharge date:   Primary Diagnosis: Loose body left hip Admission Diagnoses:  Past Medical History:  Diagnosis Date  . 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   Weight as of this encounter: 63.5 kg.  Procedure:  Procedure(s) (LRB): Left hip arthroscopic loose body removal (Left)   Consults: None  HPI: Patient presented for arthroscopic surgery of the left hip following a traumatic hip dislocation. Laboratory Data: Admission on 07/07/2018  Component Date Value Ref Range Status  . Preg Test, Ur 07/07/2018 NEGATIVE  NEGATIVE Final   Comment:        THE SENSITIVITY OF THIS METHODOLOGY IS >24 mIU/mL   Hospital Outpatient Visit on 07/01/2018  Component Date Value Ref Range Status  . WBC 07/01/2018 4.4  4.0 - 10.5 K/uL Final  . RBC 07/01/2018 4.22  3.87 - 5.11 MIL/uL Final  . Hemoglobin 07/01/2018 12.1  12.0 - 15.0 g/dL Final  . HCT 07/01/2018 38.2  36.0 - 46.0 % Final  . MCV 07/01/2018 90.5  78.0 - 100.0 fL Final  . MCH 07/01/2018 28.7  26.0 - 34.0 pg Final  . MCHC 07/01/2018 31.7  30.0 - 36.0 g/dL Final  . RDW 07/01/2018 12.8  11.5 - 15.5 % Final  . Platelets 07/01/2018 232  150 - 400 K/uL Final   Performed at [facility], [place] [address] [place], [place] [address]  Admission on 06/04/2018, Discharged on 06/04/2018  Component Date Value Ref Range Status  . Color, Urine 06/04/2018 STRAW* YELLOW Final  . APPearance 06/04/2018 CLEAR  CLEAR Final  . Specific Gravity, Urine 06/04/2018 1.004* 1.005 - 1.030 Final  . pH 06/04/2018 6.0  5.0 - 8.0 Final  . Glucose, UA 06/04/2018 NEGATIVE  NEGATIVE mg/dL Final  . Hgb  urine dipstick 06/04/2018 SMALL* NEGATIVE Final  . Bilirubin Urine 06/04/2018 NEGATIVE  NEGATIVE Final  . Ketones, ur 06/04/2018 NEGATIVE  NEGATIVE mg/dL Final  . Protein, ur 06/04/2018 NEGATIVE  NEGATIVE mg/dL Final  . Nitrite 06/04/2018 NEGATIVE  NEGATIVE Final  . Leukocytes, UA 06/04/2018 NEGATIVE  NEGATIVE Final  . Bacteria, UA 06/04/2018 NONE SEEN  NONE SEEN Final  . Squamous Epithelial / LPF 06/04/2018 0-5  0 - 5 Final   Performed at [facility] [address], [place], [place] [address]  . Sodium 06/04/2018 139  135 - 145 mmol/L Final  . Potassium 06/04/2018 3.7  3.5 - 5.1 mmol/L Final  . Chloride 06/04/2018 108  98 - 111 mmol/L Final  . CO2 06/04/2018 23  22 - 32 mmol/L Final  . Glucose, Bld 06/04/2018 106* 70 - 99 mg/dL Final  . BUN 06/04/2018 5* 6 - 20 mg/dL Final  . Creatinine, Ser 06/04/2018 0.74  0.44 - 1.00 mg/dL Final  . Calcium 06/04/2018 8.6* 8.9 - 10.3 mg/dL Final  . Total Protein 06/04/2018 6.2* 6.5 - 8.1 g/dL Final  . Albumin 06/04/2018 3.2* 3.5 - 5.0 g/dL Final  . AST 06/04/2018 37  15 - 41 U/L Final  . ALT 06/04/2018 38  0 - 44 U/L Final  . Alkaline Phosphatase 06/04/2018 55  38 - 126 U/L Final  .  Total Bilirubin 06/04/2018 1.2  0.3 - 1.2 mg/dL Final  . GFR calc non Af Amer 06/04/2018 >60  >60 mL/min Final  . GFR calc Af Amer 06/04/2018 >60  >60 mL/min Final   Comment: (NOTE) The eGFR has been calculated using the CKD EPI equation. This calculation has not been validated in all clinical situations. eGFR's persistently <60 mL/min signify possible Chronic Kidney Disease.   Georgiann Hahn gap 06/04/2018 8  5 - 15 Final   Performed at [facility], [place] [address] [place], [place] [address]  . WBC 06/04/2018 8.7  4.0 - 10.5 K/uL Final  . RBC 06/04/2018 3.79* 3.87 - 5.11 MIL/uL Final  . Hemoglobin 06/04/2018 10.8* 12.0 - 15.0 g/dL Final  . HCT 06/04/2018 34.2* 36.0 - 46.0 % Final  . MCV 06/04/2018 90.2  78.0 - 100.0 fL Final  . MCH 06/04/2018 28.5  26.0 -  34.0 pg Final  . MCHC 06/04/2018 31.6  30.0 - 36.0 g/dL Final  . RDW 06/04/2018 13.1  11.5 - 15.5 % Final  . Platelets 06/04/2018 249  150 - 400 K/uL Final  . Neutrophils Relative % 06/04/2018 82  % Final  . Neutro Abs 06/04/2018 7.1  1.7 - 7.7 K/uL Final  . Lymphocytes Relative 06/04/2018 11  % Final  . Lymphs Abs 06/04/2018 1.0  0.7 - 4.0 K/uL Final  . Monocytes Relative 06/04/2018 5  % Final  . Monocytes Absolute 06/04/2018 0.5  0.1 - 1.0 K/uL Final  . Eosinophils Relative 06/04/2018 2  % Final  . Eosinophils Absolute 06/04/2018 0.2  0.0 - 0.7 K/uL Final  . Basophils Relative 06/04/2018 0  % Final  . Basophils Absolute 06/04/2018 0.0  0.0 - 0.1 K/uL Final  . Immature Granulocytes 06/04/2018 0  % Final  . Abs Immature Granulocytes 06/04/2018 0.0  0.0 - 0.1 K/uL Final   Performed at [facility], [place] [address], [place], [place] [address]  . I-stat hCG, quantitative 06/04/2018 <5.0  <5 mIU/mL Final  . Comment 3 06/04/2018          Final   Comment:   GEST. AGE      CONC.  (mIU/mL)   <=1 WEEK        5 - 50     2 WEEKS       50 - 500     3 WEEKS       100 - 10,000     4 WEEKS     1,000 - 30,000        FEMALE AND NON-PREGNANT FEMALE:     LESS THAN 5 mIU/mL   Admission on 05/29/2018, Discharged on 05/30/2018  Component Date Value Ref Range Status  . WBC 05/29/2018 10.2  4.0 - 10.5 K/uL Final  . RBC 05/29/2018 3.98  3.87 - 5.11 MIL/uL Final  . Hemoglobin 05/29/2018 11.4* 12.0 - 15.0 g/dL Final  . HCT 05/29/2018 35.8* 36.0 - 46.0 % Final  . MCV 05/29/2018 89.9  78.0 - 100.0 fL Final  . MCH 05/29/2018 28.6  26.0 - 34.0 pg Final  . MCHC 05/29/2018 31.8  30.0 - 36.0 g/dL Final  . RDW 05/29/2018 13.1  11.5 - 15.5 % Final  . Platelets 05/29/2018 262  150 - 400 K/uL Final  . Neutrophils Relative % 05/29/2018 64  % Final  . Neutro Abs 05/29/2018 6.4  1.7 - 7.7 K/uL Final  . Lymphocytes Relative 05/29/2018 29  % Final  . Lymphs Abs 05/29/2018  2.9  0.7 - 4.0 K/uL Final  . Monocytes  Relative 05/29/2018 6  % Final  . Monocytes Absolute 05/29/2018 0.6  0.1 - 1.0 K/uL Final  . Eosinophils Relative 05/29/2018 1  % Final  . Eosinophils Absolute 05/29/2018 0.1  0.0 - 0.7 K/uL Final  . Basophils Relative 05/29/2018 0  % Final  . Basophils Absolute 05/29/2018 0.0  0.0 - 0.1 K/uL Final  . Immature Granulocytes 05/29/2018 0  % Final  . Abs Immature Granulocytes 05/29/2018 0.0  0.0 - 0.1 K/uL Final   Performed at [facility], [place] [address] [place], [place] [address]  . I-stat hCG, quantitative 05/29/2018 <5.0  <5 mIU/mL Final  . Comment 3 05/29/2018          Final   Comment:   GEST. AGE      CONC.  (mIU/mL)   <=1 WEEK        5 - 50     2 WEEKS       50 - 500     3 WEEKS       100 - 10,000     4 WEEKS     1,000 - 30,000        FEMALE AND NON-PREGNANT FEMALE:     LESS THAN 5 mIU/mL   . Sodium 05/29/2018 137  135 - 145 mmol/L Final  . Potassium 05/29/2018 3.0* 3.5 - 5.1 mmol/L Final  . Chloride 05/29/2018 108  98 - 111 mmol/L Final  . CO2 05/29/2018 19* 22 - 32 mmol/L Final  . Glucose, Bld 05/29/2018 133* 70 - 99 mg/dL Final  . BUN 05/29/2018 8  6 - 20 mg/dL Final  . Creatinine, Ser 05/29/2018 0.76  0.44 - 1.00 mg/dL Final  . Calcium 05/29/2018 8.4* 8.9 - 10.3 mg/dL Final  . GFR calc non Af Amer 05/29/2018 >60  >60 mL/min Final  . GFR calc Af Amer 05/29/2018 >60  >60 mL/min Final   Comment: (NOTE) The eGFR has been calculated using the CKD EPI equation. This calculation has not been validated in all clinical situations. eGFR's persistently <60 mL/min signify possible Chronic Kidney Disease.   . Anion gap 05/29/2018 10  5 - 15 Final   Performed at [facility], [place] [address], [place], [place] [address]  . ABO/RH(D) 05/29/2018 O POS   Final  . Antibody Screen 05/29/2018 NEG   Final  . Sample Expiration 05/29/2018    Final                   Value:06/01/2018 Performed at [facility], [place] [address] [place], [place] [address]   . ABO/RH(D)  05/29/2018    Final                   Value:O POS Performed at [facility], [place] [address] [place], [place] [address]      X-Rays:Dg C-arm 1-60 Min  Result Date: 07/07/2018 CLINICAL DATA:  Previous dislocation. Left hip arthroscopic loose body removal. 8 SEC OF FL TIME. EXAM: DG C-ARM 61-120 MIN; OPERATIVE LEFT HIP WITH PELVIS COMPARISON:  05/29/2018 and previous FINDINGS: Single fluoroscopic spot image documents surgical instruments directed towards the superior medial aspect of the left hip joint with inferior subluxation of the femoral head. IMPRESSION: Left hip arthroscopy Electronically Signed   By: Lucrezia Europe M.D.   On: 07/07/2018 13:32   Dg Hip Operative Unilat W Or W/o Pelvis Left  Result  Date: 07/07/2018 CLINICAL DATA:  Previous dislocation. Left hip arthroscopic loose body removal. 8 SEC OF FL TIME. EXAM: DG C-ARM 61-120 MIN; OPERATIVE LEFT HIP WITH PELVIS COMPARISON:  05/29/2018 and previous FINDINGS: Single fluoroscopic spot image documents surgical instruments directed towards the superior medial aspect of the left hip joint with inferior subluxation of the femoral head. IMPRESSION: Left hip arthroscopy Electronically Signed   By: Lucrezia Europe M.D.   On: 07/07/2018 13:32    EKG:No orders found for this or any previous visit.   Hospital Course: Gagandeep Chelsei Mcchesney is a 32 y.o. who was admitted to Hospital. They were brought to the operating room on 07/07/2018 and underwent Procedure(s): Left hip arthroscopic loose body removal.  Patient tolerated the procedure well and was later transferred to the recovery room and then to the orthopaedic floor for postoperative care.  They were given PO and IV analgesics for pain control following their surgery.  They were given 24 hours of postoperative antibiotics of  Anti-infectives (From admission, onward)   Start     Dose/Rate Route Frequency Ordered Stop   07/07/18 1045  ceFAZolin (ANCEF) IVPB 2g/100 mL premix     2 g 200 mL/hr over 30  Minutes Intravenous On call to O.R. 07/07/18 1035 07/07/18 1145   07/07/18 1036  ceFAZolin (ANCEF) 2-4 GM/100ML-% IVPB    Note to Pharmacy:  Merryl Hacker   : cabinet override      07/07/18 1036 07/07/18 1135     and started on DVT prophylaxis in the form of Aspirin and Lovenox.   Patient developed some increased pain in the PACU and was admitted for observation overnight.  Postoperative day #1 she demonstrated good pain control.  She had cleared physical therapy for ambulation with an anterior walker.  Of note she continues to have quite a bit of weakness with quadriceps as well as distally throughout the entire anterior and posterior compartments of the left leg.  This is been consistent since her baseline evaluation by myself.  Dressings and bandages were clean dry and intact prior to discharge.  Patient was seen in rounds and was ready to go home.   Diet: Regular diet Activity:PWB Follow-up:in 2 weeks Disposition - Home Discharged Condition: good   Discharge Instructions    Call MD / Call 911   Complete by:  As directed    If you experience chest pain or shortness of breath, CALL 911 and be transported to the hospital emergency room.  If you develope a fever above 101 F, pus (white drainage) or increased drainage or redness at the wound, or calf pain, call your surgeon's office.   Call MD / Call 911   Complete by:  As directed    If you experience chest pain or shortness of breath, CALL 911 and be transported to the hospital emergency room.  If you develope a fever above 101 F, pus (white drainage) or increased drainage or redness at the wound, or calf pain, call your surgeon's office.   Constipation Prevention   Complete by:  As directed    Drink plenty of fluids.  Prune juice may be helpful.  You may use a stool softener, such as Colace (over the counter) 100 mg twice a day.  Use MiraLax (over the counter) for constipation as needed.   Constipation Prevention   Complete by:  As  directed    Drink plenty of fluids.  Prune juice may be helpful.  You may use a stool softener, such  as Colace (over the counter) 100 mg twice a day.  Use MiraLax (over the counter) for constipation as needed.   Diet - low sodium heart healthy   Complete by:  As directed    Diet - low sodium heart healthy   Complete by:  As directed    Increase activity slowly as tolerated   Complete by:  As directed    Increase activity slowly as tolerated   Complete by:  As directed      Allergies as of 07/08/2018   No Known Allergies     Medication List    STOP taking these medications   aspirin 325 MG tablet   oxyCODONE-acetaminophen 5-325 MG tablet Commonly known as:  PERCOCET/ROXICET     TAKE these medications   diclofenac 75 MG EC tablet Commonly known as:  VOLTAREN Take 1 tablet (75 mg total) by mouth 2 (two) times daily for 14 days.   HYDROcodone-acetaminophen 5-325 MG tablet Commonly known as:  NORCO/VICODIN Take 1-2 tablets by mouth every 4 (four) hours as needed for moderate pain.   methocarbamol 750 MG tablet Commonly known as:  ROBAXIN Take 1 tablet (750 mg total) by mouth every 6 (six) hours as needed for muscle spasms. What changed:  when to take this   ondansetron 4 MG tablet Commonly known as:  ZOFRAN Take 1 tablet (4 mg total) by mouth every 8 (eight) hours as needed. What changed:  reasons to take this   polyethylene glycol packet Commonly known as:  MIRALAX / GLYCOLAX Take 17 g by mouth daily.   ranitidine 150 MG tablet Commonly known as:  ZANTAC Take 1 tablet (150 mg total) by mouth 2 (two) times daily for 14 days.      Follow-up Information    Nicholes Stairs, MD.   Specialty:  Orthopedic Surgery Why:  For suture removal, For wound re-check Contact information: [address] [place] [address] [phone]           Signed: Geralynn Rile, MD Orthopaedic Surgery 07/08/2018, 12:35 PM

## 2018-07-08 NOTE — Care Management Note (Signed)
Case Management Note  Patient Details  Name: Hailey Ferguson MRN: [id number] Date of Birth: [birth year]  Subjective/Objective:                    Action/Plan:  Nurse ordered crutches from portable. [facility] Status observation. No new home health orders. [facility] discharging today. Expected Discharge Date:  07/08/18               Expected Discharge Plan:  Home w Home Health Services  In-House Referral:     Discharge planning Services  CM Consult  Post Acute Care Choice:  Home Health Choice offered to:     DME Arranged:  Crutches DME Agency:     HH Arranged:  PT HH Agency:  [facility]  Status of Service:  Completed, signed off  If discussed at Long Length of Stay Meetings, dates discussed:    Additional Comments:  Hailey Ferguson, Hailey Tool Marie, RN 07/08/2018, 1:59 PM

## 2018-07-08 NOTE — Progress Notes (Signed)
Orthopedic Tech Progress Note Patient Details:  Hailey Ferguson [birth year] [id number]  Ortho Devices Type of Ortho Device: Crutches Ortho Device/Splint Interventions: Application   Post Interventions Patient Tolerated: Well Instructions Provided: Care of device Viewed order from doctor's order list  Nikki DomCrawford, Andranik Jeune 07/08/2018, 3:18 PM

## 2018-07-10 ENCOUNTER — Encounter (HOSPITAL_COMMUNITY): Payer: Self-pay | Admitting: Orthopedic Surgery

## 2018-07-11 NOTE — Telephone Encounter (Signed)
Pt has not returned to work. Status is currently recorded as "leave of absence." Upon chart review, pt was found to have a loose bony fragment in hip joint during postreduction CT scan. She had arthroscopic surgery for that 9/23 and is non-wt bearing x3 weeks and has ortho f/u in 2 weeks at which point further restrictions/plans will be determined. Pt likely will not return to work until at least end of October pending further eval.

## 2018-07-14 NOTE — Telephone Encounter (Signed)
Noted  

## 2018-07-14 NOTE — Telephone Encounter (Signed)
noted 

## 2018-07-17 ENCOUNTER — Other Ambulatory Visit (HOSPITAL_COMMUNITY): Payer: Self-pay | Admitting: Orthopedic Surgery

## 2018-07-17 ENCOUNTER — Ambulatory Visit (HOSPITAL_COMMUNITY)
Admission: RE | Admit: 2018-07-17 | Discharge: 2018-07-17 | Disposition: A | Discharge status: Home / Self Care | Payer: PRIVATE HEALTH INSURANCE | Source: Ambulatory Visit | Attending: Cardiology | Admitting: Cardiology

## 2018-07-17 DIAGNOSIS — M79605 Pain in left leg: Secondary | ICD-10-CM | POA: Diagnosis not present

## 2018-09-09 DIAGNOSIS — M545 Low back pain, unspecified: Secondary | ICD-10-CM

## 2018-09-09 DIAGNOSIS — M222X2 Patellofemoral disorders, left knee: Secondary | ICD-10-CM

## 2018-09-09 HISTORY — DX: Patellofemoral disorders, left knee: M22.2X2

## 2018-09-09 HISTORY — DX: Low back pain, unspecified: M54.50

## 2018-09-26 ENCOUNTER — Other Ambulatory Visit: Payer: Self-pay

## 2018-09-26 ENCOUNTER — Encounter: Payer: Self-pay | Admitting: Family Medicine

## 2018-09-26 ENCOUNTER — Ambulatory Visit (INDEPENDENT_AMBULATORY_CARE_PROVIDER_SITE_OTHER): Payer: PRIVATE HEALTH INSURANCE | Admitting: Family Medicine

## 2018-09-26 VITALS — BP 104/69 | HR 76 | Temp 98.0°F | Ht 62.0 in | Wt 142.2 lb

## 2018-09-26 DIAGNOSIS — F0781 Postconcussional syndrome: Secondary | ICD-10-CM

## 2018-09-26 DIAGNOSIS — G44309 Post-traumatic headache, unspecified, not intractable: Secondary | ICD-10-CM | POA: Diagnosis not present

## 2018-09-26 DIAGNOSIS — H60502 Unspecified acute noninfective otitis externa, left ear: Secondary | ICD-10-CM | POA: Diagnosis not present

## 2018-09-26 DIAGNOSIS — R112 Nausea with vomiting, unspecified: Secondary | ICD-10-CM

## 2018-09-26 MED ORDER — ONDANSETRON 4 MG PO TBDP: 4.0000 mg | ORAL_TABLET | Freq: Three times a day (TID) | ORAL | 0 refills | Status: DC | PRN

## 2018-09-26 MED ORDER — OFLOXACIN 0.3 % OT SOLN: 5.0000 [drp] | Freq: Every day | OTIC | 0 refills | Status: DC

## 2018-09-26 MED ORDER — AMITRIPTYLINE HCL 10 MG PO TABS: 10.0000 mg | ORAL_TABLET | Freq: Every day | ORAL | 1 refills | Status: DC
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
## 2018-09-28 ENCOUNTER — Encounter: Payer: Self-pay | Admitting: Family Medicine

## 2018-10-17 ENCOUNTER — Encounter: Payer: Self-pay | Admitting: Family Medicine

## 2018-10-17 ENCOUNTER — Other Ambulatory Visit: Payer: Self-pay

## 2018-10-17 ENCOUNTER — Ambulatory Visit (INDEPENDENT_AMBULATORY_CARE_PROVIDER_SITE_OTHER): Payer: PRIVATE HEALTH INSURANCE | Admitting: Family Medicine

## 2018-10-17 VITALS — BP 115/73 | HR 91 | Temp 98.0°F | Ht 62.0 in | Wt 146.4 lb

## 2018-10-17 DIAGNOSIS — F0781 Postconcussional syndrome: Secondary | ICD-10-CM

## 2018-10-17 DIAGNOSIS — H60392 Other infective otitis externa, left ear: Secondary | ICD-10-CM | POA: Diagnosis not present

## 2018-10-17 MED ORDER — OFLOXACIN 0.3 % OT SOLN: 10.0000 [drp] | Freq: Two times a day (BID) | OTIC | 0 refills | Status: DC
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
## 2018-10-17 NOTE — Patient Instructions (Addendum)
Restart eardrops but use them at 10 drops twice per day for continued infection.  I am also referring you to ear nose and throat doctor.  If you are unable to find the drops or for the drops let me know and we can look at other options.Return to the clinic or go to the nearest emergency room if any of your symptoms worsen or new symptoms occur.  I am glad to hear the headaches are improving.  You should still receive a call from the neurologist within the next week to 10 days.  If you do not receive a phone call please let me know.  Continue Elavil at bedtime for now.   Otitis Externa  Otitis externa is an infection of the outer ear canal. The outer ear canal is the area between the outside of the ear and the eardrum. Otitis externa is sometimes called swimmer's ear. What are the causes? Common causes of this condition include:  Swimming in dirty water.  Moisture in the ear.  An injury to the inside of the ear.  An object stuck in the ear.  A cut or scrape on the outside of the ear. What increases the risk? You are more likely to develop this condition if you go swimming often. What are the signs or symptoms? The first symptom of this condition is often itching in the ear. Later symptoms of the condition include:  Swelling of the ear.  Redness in the ear.  Ear pain. The pain may get worse when you pull on your ear.  Pus coming from the ear. How is this diagnosed? This condition may be diagnosed by examining the ear and testing fluid from the ear for bacteria and funguses. How is this treated? This condition may be treated with:  Antibiotic ear drops. These are often given for 10-14 days.  Medicines to reduce itching and swelling. Follow these instructions at home:  If you were prescribed antibiotic ear drops, use them as told by your health care provider. Do not stop using the antibiotic even if your condition improves.  Take over-the-counter and prescription medicines  only as told by your health care provider.  Avoid getting water in your ears as told by your health care provider. This may include avoiding swimming or water sports for a few days.  Keep all follow-up visits as told by your health care provider. This is important. How is this prevented?  Keep your ears dry. Use the corner of a towel to dry your ears after you swim or bathe.  Avoid scratching or putting things in your ear. Doing these things can damage the ear canal or remove the protective wax that lines it, which makes it easier for bacteria and funguses to grow.  Avoid swimming in lakes, polluted water, or pools that may not have enough chlorine. Contact a health care provider if:  You have a fever.  Your ear is still red, swollen, painful, or draining pus after 3 days.  Your redness, swelling, or pain gets worse.  You have a severe headache.  You have redness, swelling, pain, or tenderness in the area behind your ear. Summary  Otitis externa is an infection of the outer ear canal.  Common causes include swimming in dirty water, moisture in the ear, or a cut or scrape in the ear.  Symptoms include pain, redness, and swelling of the ear.  If you were prescribed antibiotic ear drops, use them as told by your health care provider. Do not  stop using the antibiotic even if your condition improves. This information is not intended to replace advice given to you by your health care provider. Make sure you discuss any questions you have with your health care provider. Document Released: 10/01/2005 Document Revised: 03/07/2018 Document Reviewed: 03/07/2018 Elsevier Interactive Patient Education  [employer].    If you have lab work done today you will be contacted with your lab results within the next 2 weeks.  If you have not heard from [place] then please contact us. The fastest way to get your results is to register for My Chart.   IF you received an x-ray today, you will  receive an invoice from [facility]. Please contact [facility] at [phone] with questions or concerns regarding your invoice.   IF you received labwork today, you will receive an invoice from [facility]. Please contact [facility] at [phone] with questions or concerns regarding your invoice.   Our billing staff will not be able to assist you with questions regarding bills from these companies.  You will be contacted with the lab results as soon as they are available. The fastest way to get your results is to activate your My Chart account. Instructions are located on the last page of this paperwork. If you have not heard from [place] regarding the results in 2 weeks, please contact this office.

## 2018-12-09 ENCOUNTER — Encounter: Payer: Self-pay | Admitting: Neurology

## 2018-12-09 ENCOUNTER — Ambulatory Visit (INDEPENDENT_AMBULATORY_CARE_PROVIDER_SITE_OTHER): Payer: PRIVATE HEALTH INSURANCE | Admitting: Neurology

## 2018-12-09 VITALS — BP 107/71 | HR 82 | Ht 62.0 in | Wt 142.2 lb

## 2018-12-09 DIAGNOSIS — F0781 Postconcussional syndrome: Secondary | ICD-10-CM

## 2018-12-09 DIAGNOSIS — G43709 Chronic migraine without aura, not intractable, without status migrainosus: Secondary | ICD-10-CM | POA: Diagnosis not present

## 2018-12-09 DIAGNOSIS — IMO0002 Reserved for concepts with insufficient information to code with codable children: Secondary | ICD-10-CM | POA: Insufficient documentation

## 2018-12-09 HISTORY — DX: Postconcussional syndrome: F07.81

## 2018-12-09 MED ORDER — AMITRIPTYLINE HCL 10 MG PO TABS: 20.0000 mg | ORAL_TABLET | Freq: Every day | ORAL | 11 refills | Status: DC
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
## 2019-08-24 ENCOUNTER — Telehealth: Payer: Self-pay | Admitting: *Deleted

## 2019-08-24 DIAGNOSIS — Z20822 Contact with and (suspected) exposure to covid-19: Secondary | ICD-10-CM

## 2019-08-24 DIAGNOSIS — Z20828 Contact with and (suspected) exposure to other viral communicable diseases: Secondary | ICD-10-CM

## 2019-08-24 NOTE — Telephone Encounter (Signed)
Noted agree with plan of care 

## 2019-08-24 NOTE — Telephone Encounter (Signed)
RN alerted by HR manager Hyacinth Meeker that pt has been quarantining at home due to close contacts, family and coworkers that are pending covid testing. Possible exposure 08/15/19. Brother in same household was asymptomatic after possible exposure but has now developed cold-like sx, and HR would like to go ahead and have pt tested as well at same time due to similar exposure timeline. Pt will be tested at [facility] site tomorrow, 08/25/19. Pt remains asymptomatic at this time and HR will communicate with pt's family member, at pt's request, regarding her testing order.

## 2019-08-25 ENCOUNTER — Other Ambulatory Visit: Payer: Self-pay

## 2019-08-25 DIAGNOSIS — Z20822 Contact with and (suspected) exposure to covid-19: Secondary | ICD-10-CM

## 2019-08-27 LAB — NOVEL CORONAVIRUS, NAA: SARS-CoV-2, NAA: NOT DETECTED

## 2019-08-31 NOTE — Progress Notes (Signed)
Plan per HR Manager Hyacinth Meeker for pt to return to work Monday 11/16.

## 2019-08-31 NOTE — Progress Notes (Signed)
noted 
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
## 2019-11-01 IMAGING — RF DG HIP (WITH OR WITHOUT PELVIS) 2-3V*L*
1 series · 3 of 3 positions shown · non-contrast
Comparison: Film from earlier in the same day.

CLINICAL DATA: Closed reduction of left hip dislocation

EXAM:
DG C-ARM 61-120 MIN; DG HIP (WITH OR WITHOUT PELVIS) 2-3V LEFT

[Series 1: run · 3 of 3 slices shown]
[im 1/3]
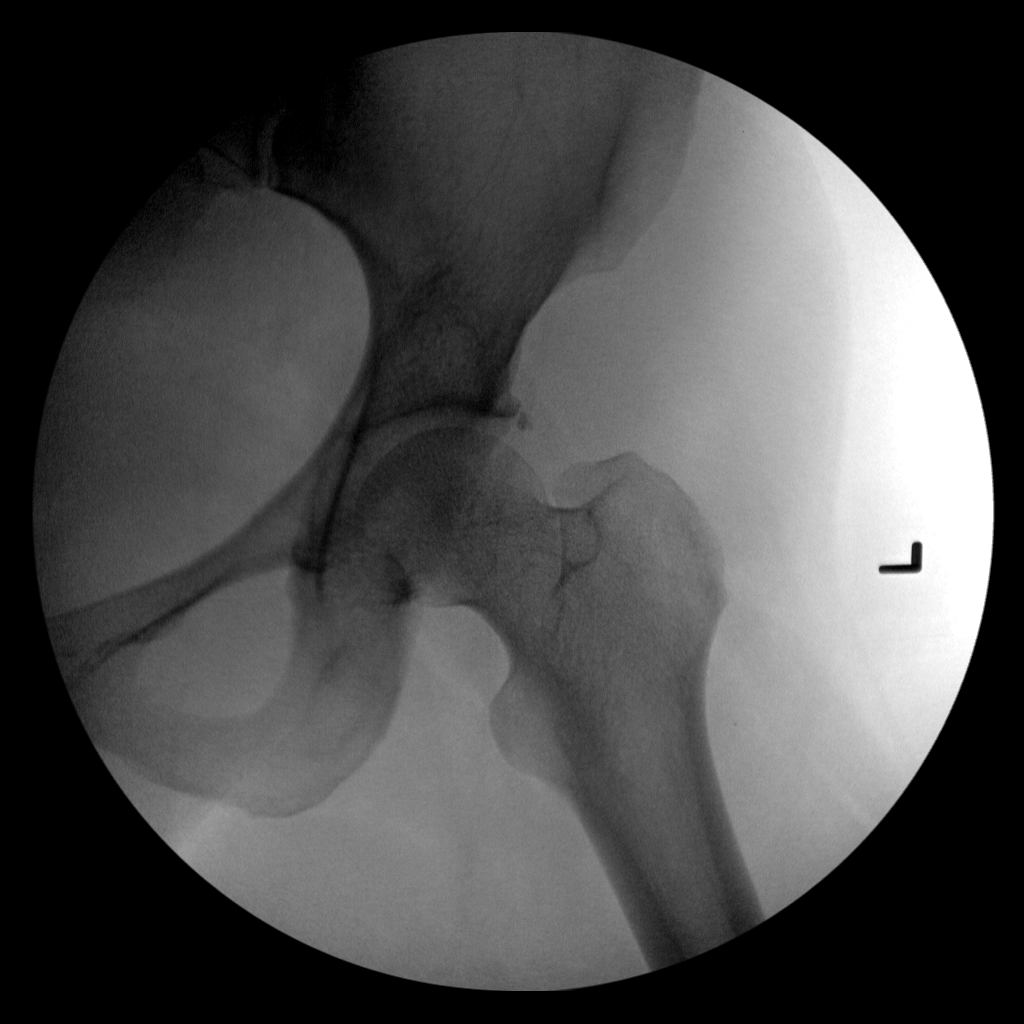
[im 2/3]
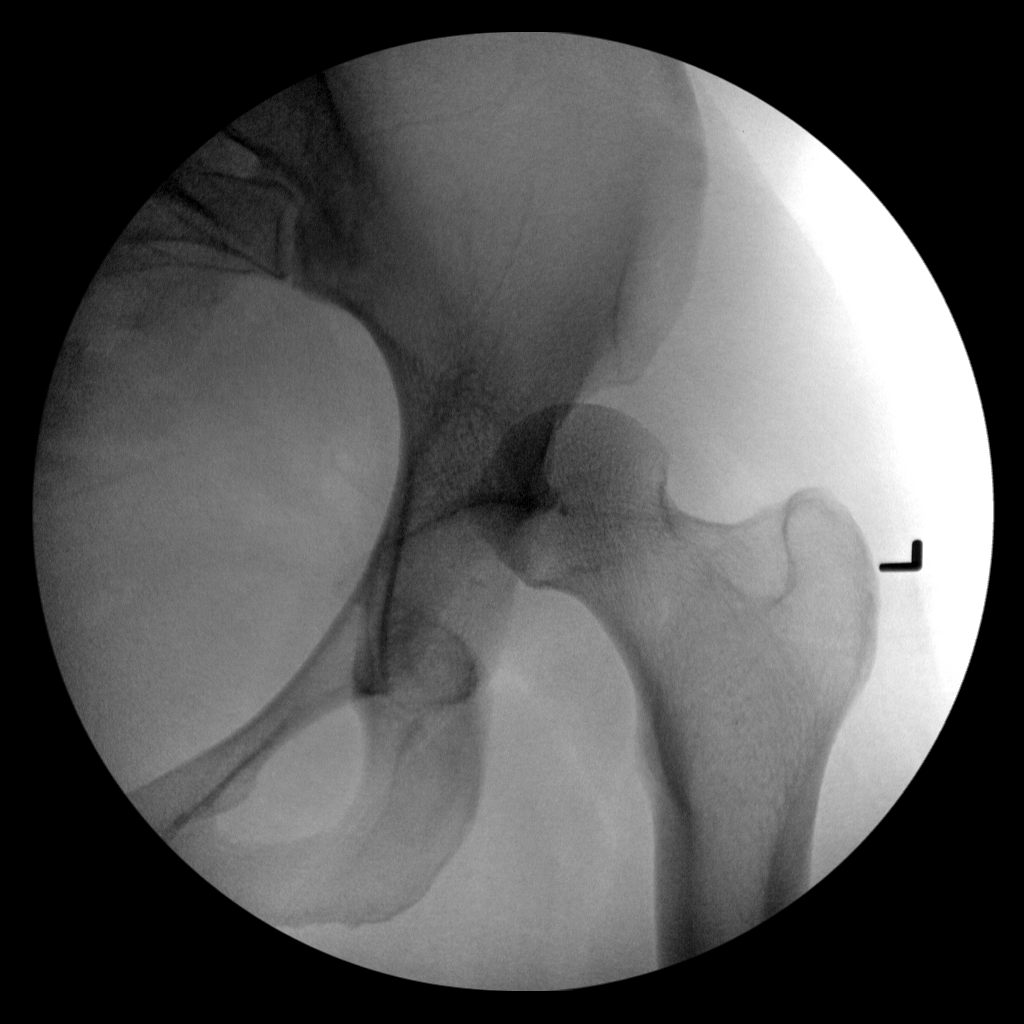
[im 3/3]
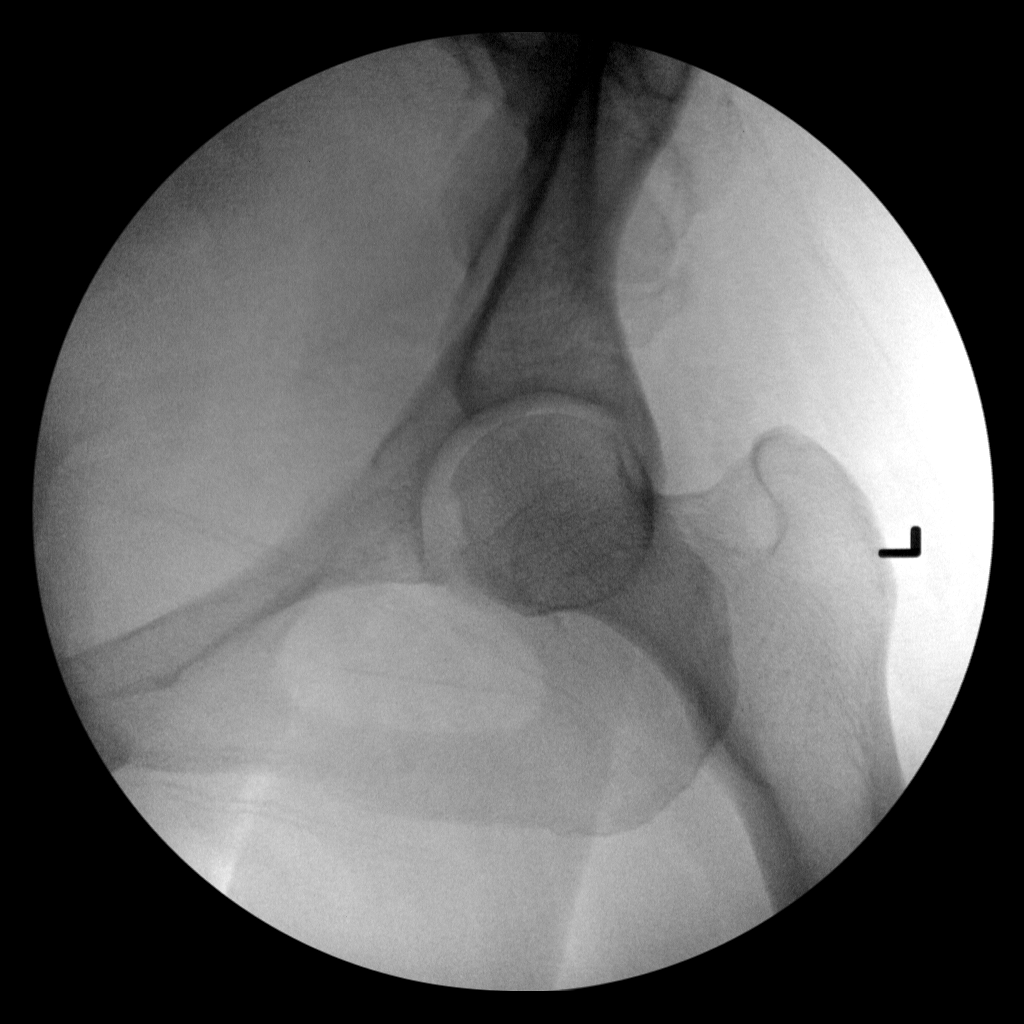

[3 of 3 positions shown; findings below may reference images not displayed]

FLUOROSCOPY TIME:  Fluoroscopy Time:  43 seconds

Radiation Exposure Index (if provided by the fluoroscopic device):
Not available

Number of Acquired Spot Images: 3
FINDINGS: Superior dislocation of the left femoral head is again identified.
Subsequent images show reduction of the femoral head into the
acetabulum. No acute abnormality is seen.
IMPRESSION: Status post closed reduction of left hip dislocation

## 2019-11-03 ENCOUNTER — Encounter: Payer: Self-pay | Admitting: *Deleted

## 2019-11-03 ENCOUNTER — Telehealth: Payer: Self-pay | Admitting: *Deleted

## 2019-11-03 NOTE — Telephone Encounter (Signed)
Noted agreed with plan of care per CDC guidelines 

## 2019-11-03 NOTE — Telephone Encounter (Signed)
RN notified on 1/18 by HR manager Salley Slaughter of pt being around potentially positive/symptomatic person. Spoke with pt over phone and she reports being in same car as symptomatic coworker each evening last week. Last exposure night of 10/30/19. Coworker began having sx 1/17, within 48hr virus shedding period. Pt denies any current sx.   Advised pt to follow 14 day asymptomatic with exposure to symptomatic/potentially positive person quarantine per CDC recommendations. Advised pt that Day 1 of quarantine is 1/16. Plan for testing on Day 7, date 11/06/19. Pt lives in [place]. [facility] closet testing site for pt. Appt made for 11/06/19 at 12:15p. Reviewed drive up testing instructions and directions with pt, ie attend appt time, remain in vehicle, wear mask, results back in 2-3 days. Will f/u pt on 11/09/19 with anticipated results. If no sx develop and no fever in previous >24hrs without antipyretics, pt to complete quarantine 1/29 and return to work 11/14/19.    Reviewed possible Covid sx including cough, ShOB, sinusitis sx, sore throat, fever/chills, body aches, fatigue, loss of taste/smell, GI sx n/v/d. Also reviewed same day/emergent eval/ER precautions of dizziness/syncope, confusion, blue tint to lips/face, severe ShOB/difficulty breathing.    Pt verbalizes understanding and agreement with plan of care. No further questions/concerns at this time. Pt reminded to contact clinic with any changes in sx or questions/concerns.

## 2019-11-06 ENCOUNTER — Ambulatory Visit: Payer: PRIVATE HEALTH INSURANCE | Attending: Internal Medicine

## 2019-11-06 DIAGNOSIS — Z20822 Contact with and (suspected) exposure to covid-19: Secondary | ICD-10-CM

## 2019-11-07 ENCOUNTER — Encounter: Payer: Self-pay | Admitting: *Deleted

## 2019-11-07 LAB — NOVEL CORONAVIRUS, NAA: SARS-CoV-2, NAA: NOT DETECTED

## 2019-11-07 NOTE — Telephone Encounter (Signed)
Covid PCR test negative. HR Tim notified and my chart message sent to patient "Bhima, Your covid test results were negative. Continue quarantine at home and monitoring for symptoms of covid e.g. runny nose, sore throat, headache, loss of taste/smell, nausea/vomiting/diarrhea, fever/chills, body aches, cough, shortness of breath/dyspnea.   Please notify clinic staff if symptoms develop.  RN Rolly Salter in clinic M, Tu, Th and Fr at extension [phone] and I am available through [email or web address] or [email or web address].  Quarantine to end  11/13/2019 and return to work 11/14/2019 as long as symptom free continues and no new known covid close positive contacts.Covid spread and incidence in community high.  Continue to protect self with mask wear, hand sanitizing washing, avoid touching face and maintaining social distancing 6 feet or greater.  [employer] POC HR Tim notified of negative test result.  Sincerely,  Albina Billet NP-C"

## 2019-11-08 NOTE — Telephone Encounter (Signed)
Please verify patient received message with results/instructions and no further questions or concerns

## 2019-11-09 NOTE — Telephone Encounter (Signed)
Pt's brother responded to his Mychart mesg reporting no sx for he or pt and that they are both still quarantining. They will contact clinic if any change in sx. No further f/u needed at this time.

## 2019-11-09 NOTE — Telephone Encounter (Signed)
Noted my chart message reviewed

## 2019-11-09 NOTE — Telephone Encounter (Signed)
Attempted to call pt. No answer. Called pt's brother as she had requested at start of quarantine. No answer. LVM requesting call back or reply to NP mychart mesg.

## 2019-11-15 NOTE — Telephone Encounter (Signed)
Please verify if patient returned to work as planned 

## 2019-11-16 NOTE — Telephone Encounter (Signed)
Confirmed with HR that pt is returning to work on-site today, 2/1 on 2nd shift, as expected.

## 2019-11-16 NOTE — Telephone Encounter (Signed)
noted 

## 2019-11-20 ENCOUNTER — Ambulatory Visit (INDEPENDENT_AMBULATORY_CARE_PROVIDER_SITE_OTHER): Payer: PRIVATE HEALTH INSURANCE | Admitting: Family Medicine

## 2019-11-20 ENCOUNTER — Ambulatory Visit (INDEPENDENT_AMBULATORY_CARE_PROVIDER_SITE_OTHER): Payer: PRIVATE HEALTH INSURANCE

## 2019-11-20 ENCOUNTER — Other Ambulatory Visit: Payer: Self-pay

## 2019-11-20 ENCOUNTER — Encounter: Payer: Self-pay | Admitting: Family Medicine

## 2019-11-20 VITALS — BP 117/72 | HR 85 | Temp 98.2°F | Ht 62.0 in | Wt 150.0 lb

## 2019-11-20 DIAGNOSIS — Z23 Encounter for immunization: Secondary | ICD-10-CM

## 2019-11-20 DIAGNOSIS — M25562 Pain in left knee: Secondary | ICD-10-CM

## 2019-11-20 DIAGNOSIS — M549 Dorsalgia, unspecified: Secondary | ICD-10-CM

## 2019-11-20 LAB — POCT URINALYSIS DIP (MANUAL ENTRY)
Bilirubin, UA: NEGATIVE
Blood, UA: NEGATIVE
Glucose, UA: NEGATIVE mg/dL
Ketones, POC UA: NEGATIVE mg/dL
Nitrite, UA: NEGATIVE
Protein Ur, POC: NEGATIVE mg/dL
Spec Grav, UA: <=1.005 — AB (ref 1.010–1.025)
Urobilinogen, UA: 0.2 E.U./dL
pH, UA: 6 (ref 5.0–8.0)

## 2019-11-20 MED ORDER — METHOCARBAMOL 750 MG PO TABS: 750.0000 mg | ORAL_TABLET | Freq: Every evening | ORAL | 0 refills | Status: DC | PRN

## 2019-11-20 MED ORDER — MELOXICAM 7.5 MG PO TABS: 7.5000 mg | ORAL_TABLET | Freq: Every day | ORAL | 0 refills | Status: DC

## 2019-11-20 NOTE — Patient Instructions (Addendum)
Mobic once per day for back and knee pain until you are seen by orthopedist.  Call their office again Monday if he has not heard from them.  Robaxin at bedtime if needed.  I do recommend continuing to wear back brace and knee brace if needed throughout the day.  See information below on both of those conditions.  Heat or ice, gentle range of motion for the back throughout the day can be helpful.  I will let you know if there are concerns on your x-ray.   Acute Knee Pain, Adult Acute knee pain is sudden and may be caused by damage, swelling, or irritation of the muscles and tissues that support your knee. The injury may result from:  A fall.  An injury to your knee from twisting motions.  A hit to the knee.  Infection. Acute knee pain may go away on its own with time and rest. If it does not, your health care provider may order tests to find the cause of the pain. These may include:  Imaging tests, such as an X-ray, MRI, or ultrasound.  Joint aspiration. In this test, fluid is removed from the knee.  Arthroscopy. In this test, a lighted tube is inserted into the knee and an image is projected onto a TV screen.  Biopsy. In this test, a sample of tissue is removed from the body and studied under a microscope. Follow these instructions at home: Pay attention to any changes in your symptoms. Take these actions to relieve your pain. If you have a knee sleeve or brace:   Wear the sleeve or brace as told by your health care provider. Remove it only as told by your health care provider.  Loosen the sleeve or brace if your toes tingle, become numb, or turn cold and blue.  Keep the sleeve or brace clean.  If the sleeve or brace is not waterproof: ? Do not let it get wet. ? Cover it with a watertight covering when you take a bath or shower. Activity  Rest your knee.  Do not do things that cause pain or make pain worse.  Avoid high-impact activities or exercises, such as running, jumping  rope, or doing jumping jacks.  Work with a physical therapist to make a safe exercise program, as recommended by your health care provider. Do exercises as told by your physical therapist. Managing pain, stiffness, and swelling   If directed, put ice on the knee: ? Put ice in a plastic bag. ? Place a towel between your skin and the bag. ? Leave the ice on for 20 minutes, 2-3 times a day.  If directed, use an elastic bandage to put pressure (compression) on your injured knee. This may control swelling, give support, and help with discomfort. General instructions  Take over-the-counter and prescription medicines only as told by your health care provider.  Raise (elevate) your knee above the level of your heart when you are sitting or lying down.  Sleep with a pillow under your knee.  Do not use any products that contain nicotine or tobacco, such as cigarettes, e-cigarettes, and chewing tobacco. These can delay healing. If you need help quitting, ask your health care provider.  If you are overweight, work with your health care provider and a dietitian to set a weight-loss goal that is healthy and reasonable for you. Extra weight can put pressure on your knee.  Keep all follow-up visits as told by your health care provider. This is important. Contact a  health care provider if:  Your knee pain continues, changes, or gets worse.  You have a fever along with knee pain.  Your knee feels warm to the touch.  Your knee buckles or locks up. Get help right away if:  Your knee swells, and the swelling becomes worse.  You cannot move your knee.  You have severe pain in your knee. Summary  Acute knee pain can be caused by a fall, an injury, an infection, or damage, swelling, or irritation of the tissues that support your knee.  Your health care provider may perform tests to find out the cause of the pain.  Pay attention to any changes in your symptoms. Relieve your pain with rest,  medicines, light activity, and use of ice.  Get help if your pain continues or becomes worse, your knee swells, or you cannot move your knee. This information is not intended to replace advice given to you by your health care provider. Make sure you discuss any questions you have with your health care provider. Document Revised: 03/13/2018 Document Reviewed: 03/13/2018 Elsevier Patient Education  2020 [employer]  Acute Back Pain, Adult Acute back pain is sudden and usually short-lived. It is often caused by an injury to the muscles and tissues in the back. The injury may result from:  A muscle or ligament getting overstretched or torn (strained). Ligaments are tissues that connect bones to each other. Lifting something improperly can cause a back strain.  Wear and tear (degeneration) of the spinal disks. Spinal disks are circular tissue that provides cushioning between the bones of the spine (vertebrae).  Twisting motions, such as while playing sports or doing yard work.  A hit to the back.  Arthritis. You may have a physical exam, lab tests, and imaging tests to find the cause of your pain. Acute back pain usually goes away with rest and home care. Follow these instructions at home: Managing pain, stiffness, and swelling  Take over-the-counter and prescription medicines only as told by your health care provider.  Your health care provider may recommend applying ice during the first 24-48 hours after your pain starts. To do this: ? Put ice in a plastic bag. ? Place a towel between your skin and the bag. ? Leave the ice on for 20 minutes, 2-3 times a day.  If directed, apply heat to the affected area as often as told by your health care provider. Use the heat source that your health care provider recommends, such as a moist heat pack or a heating pad. ? Place a towel between your skin and the heat source. ? Leave the heat on for 20-30 minutes. ? Remove the heat if your skin turns  bright red. This is especially important if you are unable to feel pain, heat, or cold. You have a greater risk of getting burned. Activity   Do not stay in bed. Staying in bed for more than 1-2 days can delay your recovery.  Sit up and stand up straight. Avoid leaning forward when you sit, or hunching over when you stand. ? If you work at a desk, sit close to it so you do not need to lean over. Keep your chin tucked in. Keep your neck drawn back, and keep your elbows bent at a right angle. Your arms should look like the letter "L." ? Sit high and close to the steering wheel when you drive. Add lower back (lumbar) support to your car seat, if needed.  Take short walks  on even surfaces as soon as you are able. Try to increase the length of time you walk each day.  Do not sit, drive, or stand in one place for more than 30 minutes at a time. Sitting or standing for long periods of time can put stress on your back.  Do not drive or use heavy machinery while taking prescription pain medicine.  Use proper lifting techniques. When you bend and lift, use positions that put less stress on your back: ? Nunn your knees. ? Keep the load close to your body. ? Avoid twisting.  Exercise regularly as told by your health care provider. Exercising helps your back heal faster and helps prevent back injuries by keeping muscles strong and flexible.  Work with a physical therapist to make a safe exercise program, as recommended by your health care provider. Do any exercises as told by your physical therapist. Lifestyle  Maintain a healthy weight. Extra weight puts stress on your back and makes it difficult to have good posture.  Avoid activities or situations that make you feel anxious or stressed. Stress and anxiety increase muscle tension and can make back pain worse. Learn ways to manage anxiety and stress, such as through exercise. General instructions  Sleep on a firm mattress in a comfortable  position. Try lying on your side with your knees slightly bent. If you lie on your back, put a pillow under your knees.  Follow your treatment plan as told by your health care provider. This may include: ? Cognitive or behavioral therapy. ? Acupuncture or massage therapy. ? Meditation or yoga. Contact a health care provider if:  You have pain that is not relieved with rest or medicine.  You have increasing pain going down into your legs or buttocks.  Your pain does not improve after 2 weeks.  You have pain at night.  You lose weight without trying.  You have a fever or chills. Get help right away if:  You develop new bowel or bladder control problems.  You have unusual weakness or numbness in your arms or legs.  You develop nausea or vomiting.  You develop abdominal pain.  You feel faint. Summary  Acute back pain is sudden and usually short-lived.  Use proper lifting techniques. When you bend and lift, use positions that put less stress on your back.  Take over-the-counter and prescription medicines and apply heat or ice as directed by your health care provider. This information is not intended to replace advice given to you by your health care provider. Make sure you discuss any questions you have with your health care provider. Document Revised: 01/20/2019 Document Reviewed: 05/15/2017 Elsevier Patient Education  [employer].  If you have lab work done today you will be contacted with your lab results within the next 2 weeks.  If you have not heard from [place] then please contact us. The fastest way to get your results is to register for My Chart.   IF you received an x-ray today, you will receive an invoice from [facility]. Please contact [facility] at [phone] with questions or concerns regarding your invoice.   IF you received labwork today, you will receive an invoice from [facility]. Please contact [facility] at [phone] with questions  or concerns regarding your invoice.   Our billing staff will not be able to assist you with questions regarding bills from these companies.  You will be contacted with the lab results as soon as they are available. The fastest  way to get your results is to activate your My Chart account. Instructions are located on the last page of this paperwork. If you have not heard from [place] regarding the results in 2 weeks, please contact this office.

## 2019-11-24 NOTE — Telephone Encounter (Signed)
Patient returned to work as expected confirmed with HR.

## 2020-01-07 ENCOUNTER — Ambulatory Visit: Payer: PRIVATE HEALTH INSURANCE | Attending: Internal Medicine

## 2020-01-07 DIAGNOSIS — Z23 Encounter for immunization: Secondary | ICD-10-CM

## 2020-01-07 NOTE — Progress Notes (Signed)
   Covid-19 Vaccination Clinic  Name:  Hailey Ferguson    MRN: [id number] DOB: [birth year]  01/07/2020  Ms. Grand was observed post Covid-19 immunization for 15 minutes without incident. She was provided with Vaccine Information Sheet and instruction to access the V-Safe system.   [name] was instructed to call 911 with any severe reactions post vaccine: Marland Kitchen Difficulty breathing  . Swelling of face and throat  . A fast heartbeat  . A bad rash all over body  . Dizziness and weakness   Immunizations Administered    Name Date Dose VIS Date Route   Moderna COVID-19 Vaccine 01/07/2020 12:18 PM 0.5 mL 09/15/2019 Intramuscular   Manufacturer: Moderna   Lot: [id number]   NDC: 85631-497-02

## 2020-02-09 ENCOUNTER — Ambulatory Visit: Payer: PRIVATE HEALTH INSURANCE | Attending: Internal Medicine

## 2020-02-09 DIAGNOSIS — Z23 Encounter for immunization: Secondary | ICD-10-CM

## 2020-02-09 NOTE — Progress Notes (Signed)
   Covid-19 Vaccination Clinic  Name:  Hailey Ferguson    MRN: [id number] DOB: July 19, 1986  02/09/2020  [name] was observed post Covid-19 immunization for 15 minutes without incident. She was provided with Vaccine Information Sheet and instruction to access the V-Safe system.   [name] was instructed to call 911 with any severe reactions post vaccine: Marland Kitchen Difficulty breathing  . Swelling of face and throat  . A fast heartbeat  . A bad rash all over body  . Dizziness and weakness   Immunizations Administered    Name Date Dose VIS Date Route   Moderna COVID-19 Vaccine 02/09/2020 12:54 PM 0.5 mL 09/2019 Intramuscular   Manufacturer: Gala Murdoch   Lot: [id number]   NDC: 80777-273-99      Covid-19 Vaccination Clinic  Name:  Hailey Ferguson    MRN: [id number] DOB: [birth year]  02/09/2020  [name] was observed post Covid-19 immunization for 15 minutes without incident. She was provided with Vaccine Information Sheet and instruction to access the V-Safe system.   [name] was instructed to call 911 with any severe reactions post vaccine: Marland Kitchen Difficulty breathing  . Swelling of face and throat  . A fast heartbeat  . A bad rash all over body  . Dizziness and weakness   Immunizations Administered    Name Date Dose VIS Date Route   Moderna COVID-19 Vaccine 02/09/2020 12:54 PM 0.5 mL 09/2019 Intramuscular   Manufacturer: Moderna   Lot: [id number]   NDC: 63817-711-65
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
## 2021-06-05 ENCOUNTER — Ambulatory Visit: Payer: Self-pay | Admitting: *Deleted

## 2021-06-05 ENCOUNTER — Other Ambulatory Visit: Payer: Self-pay

## 2021-06-05 VITALS — BP 96/69 | HR 67 | Ht 59.5 in | Wt 123.0 lb

## 2021-06-05 DIAGNOSIS — Z0000 Encounter for general adult medical examination without abnormal findings: Secondary | ICD-10-CM

## 2021-06-05 NOTE — Progress Notes (Signed)
[employer] insurance premium discount evaluation: Labs Drawn. Replacements ROI form signed. Tobacco Free Attestation form signed.  Forms placed in paper chart.  

## 2021-06-06 ENCOUNTER — Encounter: Payer: Self-pay | Admitting: Registered Nurse

## 2021-06-06 LAB — CMP12+LP+TP+TSH+6AC+CBC/D/PLT
ALT: 14 IU/L (ref 0–32)
AST: 15 IU/L (ref 0–40)
Albumin/Globulin Ratio: 1.8 (ref 1.2–2.2)
Albumin: 4.6 g/dL (ref 3.8–4.8)
Alkaline Phosphatase: 60 IU/L (ref 44–121)
BUN/Creatinine Ratio: 14 (ref 9–23)
BUN: 10 mg/dL (ref 6–20)
Basophils Absolute: 0 10*3/uL (ref 0.0–0.2)
Basos: 1 %
Bilirubin Total: 0.5 mg/dL (ref 0.0–1.2)
Calcium: 9 mg/dL (ref 8.7–10.2)
Chloride: 104 mmol/L (ref 96–106)
Chol/HDL Ratio: 3.5 ratio (ref 0.0–4.4)
Cholesterol, Total: 190 mg/dL (ref 100–199)
Creatinine, Ser: 0.72 mg/dL (ref 0.57–1.00)
EOS (ABSOLUTE): 0.2 10*3/uL (ref 0.0–0.4)
Eos: 3 %
Estimated CHD Risk: 0.5 times avg. (ref 0.0–1.0)
Free Thyroxine Index: 2.6 (ref 1.2–4.9)
GGT: 12 IU/L (ref 0–60)
Globulin, Total: 2.6 g/dL (ref 1.5–4.5)
Glucose: 87 mg/dL (ref 65–99)
HDL: 55 mg/dL (ref >39)
Hematocrit: 36.5 % (ref 34.0–46.6)
Hemoglobin: 12.1 g/dL (ref 11.1–15.9)
Immature Grans (Abs): 0 10*3/uL (ref 0.0–0.1)
Immature Granulocytes: 0 %
Iron: 46 ug/dL (ref 27–159)
LDH: 168 IU/L (ref 119–226)
LDL Chol Calc (NIH): 124 mg/dL — ABNORMAL HIGH (ref 0–99)
Lymphocytes Absolute: 1.3 10*3/uL (ref 0.7–3.1)
Lymphs: 24 %
MCH: 28.7 pg (ref 26.6–33.0)
MCHC: 33.2 g/dL (ref 31.5–35.7)
MCV: 87 fL (ref 79–97)
Monocytes Absolute: 0.3 10*3/uL (ref 0.1–0.9)
Monocytes: 6 %
Neutrophils Absolute: 3.6 10*3/uL (ref 1.4–7.0)
Neutrophils: 66 %
Phosphorus: 3.9 mg/dL (ref 3.0–4.3)
Platelets: 227 10*3/uL (ref 150–450)
Potassium: 4.3 mmol/L (ref 3.5–5.2)
RBC: 4.21 x10E6/uL (ref 3.77–5.28)
RDW: 12.7 % (ref 11.7–15.4)
Sodium: 138 mmol/L (ref 134–144)
T3 Uptake Ratio: 32 % (ref 24–39)
T4, Total: 8.1 ug/dL (ref 4.5–12.0)
TSH: 2.51 u[IU]/mL (ref 0.450–4.500)
Total Protein: 7.2 g/dL (ref 6.0–8.5)
Triglycerides: 56 mg/dL (ref 0–149)
Uric Acid: 4.3 mg/dL (ref 2.6–6.2)
VLDL Cholesterol Cal: 11 mg/dL (ref 5–40)
WBC: 5.3 10*3/uL (ref 3.4–10.8)
eGFR: 112 mL/min/{1.73_m2} (ref >59)

## 2021-06-06 LAB — HGB A1C W/O EAG: Hgb A1c MFr Bld: 5.5 % (ref 4.8–5.6)

## 2021-06-06 NOTE — Progress Notes (Signed)
Noted  

## 2021-06-06 NOTE — Progress Notes (Signed)
Results reviewed in clinic with pt. Provided hard copy of labs and exitcare handouts. Diet and exercise recommendations discussed re: LDL and health maintenance. Routine f/u with pcp. Results routed to pcp per pt request. No further questions/concerns.

## 2021-09-19 ENCOUNTER — Ambulatory Visit (HOSPITAL_COMMUNITY)
Admission: EM | Admit: 2021-09-19 | Discharge: 2021-09-19 | Disposition: A | Discharge status: Home / Self Care | Payer: No Typology Code available for payment source | Attending: Urgent Care | Admitting: Urgent Care

## 2021-09-19 ENCOUNTER — Encounter (HOSPITAL_COMMUNITY): Payer: Self-pay

## 2021-09-19 ENCOUNTER — Other Ambulatory Visit: Payer: Self-pay

## 2021-09-19 DIAGNOSIS — Z20822 Contact with and (suspected) exposure to covid-19: Secondary | ICD-10-CM | POA: Insufficient documentation

## 2021-09-19 DIAGNOSIS — J069 Acute upper respiratory infection, unspecified: Secondary | ICD-10-CM | POA: Diagnosis present

## 2021-09-19 DIAGNOSIS — R0981 Nasal congestion: Secondary | ICD-10-CM | POA: Insufficient documentation

## 2021-09-19 LAB — RESPIRATORY PANEL BY PCR

## 2021-09-19 MED ORDER — PROMETHAZINE-DM 6.25-15 MG/5ML PO SYRP: 5.0000 mL | ORAL_SOLUTION | Freq: Every evening | ORAL | 0 refills | Status: DC | PRN

## 2021-09-19 MED ORDER — CETIRIZINE HCL 10 MG PO TABS: 10.0000 mg | ORAL_TABLET | Freq: Every day | ORAL | 0 refills | Status: DC

## 2021-09-19 MED ORDER — BENZONATATE 100 MG PO CAPS: 100.0000 mg | ORAL_CAPSULE | Freq: Three times a day (TID) | ORAL | 0 refills | Status: DC | PRN

## 2021-09-19 MED ORDER — PSEUDOEPHEDRINE HCL 60 MG PO TABS: 60.0000 mg | ORAL_TABLET | Freq: Three times a day (TID) | ORAL | 0 refills | Status: DC | PRN

## 2021-09-19 NOTE — Discharge Instructions (Signed)
We will notify you of your test results as they arrive and may take between 48-72 hours.  I encourage you to sign up for MyChart if you have not already done so as this can be the easiest way for us to communicate results to you online or through a phone app.  Generally, we only contact you if it is a positive test result.  In the meantime, if you develop worsening symptoms including fever, chest pain, shortness of breath despite our current treatment plan then please report to the emergency room as this may be a sign of worsening status from possible viral infection. ° °Otherwise, we will manage this as a viral syndrome. For sore throat or cough try using a honey-based tea. Use 3 teaspoons of honey with juice squeezed from half lemon. Place shaved pieces of ginger into 1/2-1 cup of water and warm over stove top. Then mix the ingredients and repeat every 4 hours as needed. Please take Tylenol 500mg-650mg every 6 hours for aches and pains, fevers. Hydrate very well with at least 2 liters of water. Eat light meals such as soups to replenish electrolytes and soft fruits, veggies. Start an antihistamine like Zyrtec for postnasal drainage, sinus congestion.  You can take this together with pseudoephedrine (Sudafed) at a dose of 60 mg 2-3 times a day as needed for the same kind of congestion.  Use the cough medications as needed.  °

## 2021-09-19 NOTE — ED Provider Notes (Signed)
[facility]   MRN: [id number] DOB: 27-Jun-1986  Subjective:   Hailey Ferguson is a 35 y.o. female presenting for 4-day history of cute onset fevers, coughing, postnasal drainage and sinus pressure, sinus headaches.  No neck pain, confusion, weakness, chest pain, shortness of breath, wheezing.  Has not had any sick contacts.  Would like to have COVID and flu testing.  No history of respiratory disorders.  No current facility-administered medications for this encounter. No current outpatient medications on file.   No Known Allergies  Past Medical History:  Diagnosis Date   Headache    Post concussive syndrome      Past Surgical History:  Procedure Laterality Date   HIP ARTHROSCOPY Left 07/07/2018   Procedure: Left hip arthroscopic loose body removal;  Surgeon: Yolonda Kida, MD;  Location: [facility] OR;  Service: Orthopedics;  Laterality: Left;  120 mins   HIP CLOSED REDUCTION Left 05/29/2018   Procedure: CLOSED REDUCTION HIP;  Surgeon: Roby Lofts, MD;  Location: [facility] OR;  Service: Orthopedics;  Laterality: Left;    Family History  Problem Relation Age of Onset   Dementia Mother    Dementia Father     Social History   Tobacco Use   Smoking status: Never   Smokeless tobacco: Never  Vaping Use   Vaping Use: Never used  Substance Use Topics   Alcohol use: No   Drug use: No    ROS   Objective:   Vitals: BP 105/70 (BP Location: Left Arm)   Pulse 76   Temp 98.2 F (36.8 C) (Oral)   Resp 18   LMP  (Within Weeks) Comment: 3-4 weeks  SpO2 98%   Physical Exam Constitutional:      General: She is not in acute distress.    Appearance: Normal appearance. She is well-developed. She is not ill-appearing, toxic-appearing or diaphoretic.  HENT:     Head: Normocephalic and atraumatic.     Right Ear: Tympanic membrane, ear canal and external ear normal. No drainage or tenderness. No middle ear effusion. Tympanic membrane is not erythematous.      Left Ear: Tympanic membrane, ear canal and external ear normal. No drainage or tenderness.  No middle ear effusion. Tympanic membrane is not erythematous.     Nose: Nose normal. No congestion or rhinorrhea.     Mouth/Throat:     Mouth: Mucous membranes are moist. No oral lesions.     Pharynx: No pharyngeal swelling, oropharyngeal exudate, posterior oropharyngeal erythema or uvula swelling.     Tonsils: No tonsillar exudate or tonsillar abscesses.  Eyes:     General: No scleral icterus.       Right eye: No discharge.        Left eye: No discharge.     Extraocular Movements: Extraocular movements intact.     Right eye: Normal extraocular motion.     Left eye: Normal extraocular motion.     Conjunctiva/sclera: Conjunctivae normal.     Pupils: Pupils are equal, round, and reactive to light.  Cardiovascular:     Rate and Rhythm: Normal rate and regular rhythm.     Pulses: Normal pulses.     Heart sounds: Normal heart sounds. No murmur heard.   No friction rub. No gallop.  Pulmonary:     Effort: Pulmonary effort is normal. No respiratory distress.     Breath sounds: Normal breath sounds. No stridor. No wheezing, rhonchi or rales.  Musculoskeletal:     Cervical  back: Normal range of motion and neck supple.  Lymphadenopathy:     Cervical: No cervical adenopathy.  Skin:    General: Skin is warm and dry.     Findings: No rash.  Neurological:     General: No focal deficit present.     Mental Status: She is alert and oriented to person, place, and time.     Cranial Nerves: No cranial nerve deficit.     Motor: No weakness.     Coordination: Coordination normal.     Gait: Gait normal.     Deep Tendon Reflexes: Reflexes normal.  Psychiatric:        Mood and Affect: Mood normal.        Behavior: Behavior normal.        Thought Content: Thought content normal.        Judgment: Judgment normal.    Assessment and Plan :   PDMP not reviewed this encounter.  1. Viral URI with cough   2.  Nasal congestion    Deferred imaging given clear cardiopulmonary exam, hemodynamically stable vital signs. COVID and flu test pending.  We will otherwise manage for viral upper respiratory infection.  Physical exam findings reassuring and vital signs stable for discharge. Advised supportive care, offered symptomatic relief. Counseled patient on potential for adverse effects with medications prescribed/recommended today, ER and return-to-clinic precautions discussed, patient verbalized understanding.       Wallis Bamberg, PA-C 09/19/21 1450

## 2021-09-19 NOTE — ED Triage Notes (Signed)
Pt reports fever, cough and headache x 4 days.

## 2021-09-20 LAB — SARS CORONAVIRUS 2 (TAT 6-24 HRS): SARS Coronavirus 2: NEGATIVE

## 2021-09-21 ENCOUNTER — Encounter: Payer: Self-pay | Admitting: Registered Nurse

## 2021-09-21 ENCOUNTER — Telehealth: Payer: Self-pay | Admitting: Registered Nurse

## 2021-09-21 DIAGNOSIS — B348 Other viral infections of unspecified site: Secondary | ICD-10-CM

## 2021-09-21 NOTE — Telephone Encounter (Signed)
Notified by HR supervisor sent patient home today prior to clinic opening for runny nose and headache.  Patient contacted via telephone and stated she was sent home from work on Monday and was seen by provider and tested positive for flu.  She stated she notified her supervisor and stayed home Tuesday  Noted in Epic parainfluenza virus type 1 positive.  Covid negative and influenza A&B negative.  Patient received Rx from her provider to help with viral URI.  Patient stated she was wearing mask at work today and feeling better than Monday.  Discussed with patient may return to work tomorrow if feeling better/ready to work wearing a mask.  Should not eat in employee lunch room until symptoms resolved.  I encouraged hydration/rest today.  Patient at home when I contacted her.  Patient A&Ox3 spoke full sentences without difficulty.  No audible cough/throat clearing or congestion during 5 minute telephone call.  CDC website does not have information available on how long viral shedding typically lasts with parainfluenza.  RN Rolly Salter will follow up with patient again tomorrow.  HR notified cleared to return onsite with mask wear until symptoms resolved. Patient verbalized understanding information/instructions, agreed with plan of care and had no further questions at this time.

## 2021-09-23 NOTE — Telephone Encounter (Signed)
Patient returned call stated RTW 09/22/21 and feeling well today no concerns or questions.  A&Ox3 spoke full sentences without difficulty no cough/congestion/throat clearing audible during 2 minute telephone call.  Patient may contact me at this number if questions or concerns when clinic closed.  HR notified RTW 12/9 as expected and symptoms improved.  Encounter closed.  Patient verbalized understanding information/instructions, agreed with plan of care and had no further questions at this time.

## 2022-06-05 ENCOUNTER — Ambulatory Visit: Payer: No Typology Code available for payment source | Admitting: Registered Nurse

## 2022-06-05 ENCOUNTER — Encounter: Payer: Self-pay | Admitting: Registered Nurse

## 2022-06-05 VITALS — BP 89/65 | HR 92 | Resp 16

## 2022-06-05 DIAGNOSIS — R42 Dizziness and giddiness: Secondary | ICD-10-CM

## 2022-06-05 DIAGNOSIS — N920 Excessive and frequent menstruation with regular cycle: Secondary | ICD-10-CM

## 2022-06-05 NOTE — Progress Notes (Unsigned)
Subjective:    Patient ID: Hailey Ferguson, female    DOB: [birth year], 36 y.o.   MRN: [id number]  36y/o established female here for evaluation dizziness while working at [employer] in [employer].  Works standing.  Stated happens with her menstrual cycle every month some cramping and dizziness.  Ate rice and vegetables for breakfast and has been drinking water.  Last urinated prior to arriving at clinic.  Unsure of urine color due to menses blood unable to tell.  Had am break today.  Lunch scheduled for 1200.  Shift ends 1430.  Reviewed RN Larina Bras note.  Patient stated has been changing feminine products every couple of hours while awake and flow has been heavy x 4 days and "the usual"  Last labs Aug 2022.  Denied fever/chills/n/v/d/recent illness/body aches/feeling bad/sweats/cough/URI symptoms/rash/bruising.  Denied others sick at home.      Review of Systems  Constitutional:  Negative for activity change, appetite change, chills, diaphoresis, fatigue and fever.  HENT:  Negative for congestion, postnasal drip, rhinorrhea, sinus pressure, sinus pain, sore throat, trouble swallowing and voice change.   Eyes:  Negative for photophobia and visual disturbance.  Respiratory:  Negative for cough, shortness of breath, wheezing and stridor.   Cardiovascular:  Negative for chest pain, palpitations and leg swelling.  Gastrointestinal:  Positive for abdominal pain. Negative for diarrhea, nausea and vomiting.  Endocrine: Negative for cold intolerance and heat intolerance.  Genitourinary:  Positive for vaginal bleeding. Negative for decreased urine volume, difficulty urinating and menstrual problem.  Musculoskeletal:  Negative for arthralgias, back pain, gait problem, joint swelling, myalgias, neck pain and neck stiffness.  Skin:  Negative for color change, rash and wound.  Allergic/Immunologic: Negative for food allergies.  Neurological:  Positive for dizziness and light-headedness. Negative for  tremors, seizures, syncope, facial asymmetry, speech difficulty, weakness, numbness and headaches.  Hematological:  Negative for adenopathy. Does not bruise/bleed easily.  Psychiatric/Behavioral:  Negative for agitation, confusion and sleep disturbance.        Objective:   Physical Exam Vitals and nursing note reviewed.  Constitutional:      General: She is awake. She is not in acute distress.    Appearance: Normal appearance. She is well-developed, well-groomed and normal weight. She is not ill-appearing, toxic-appearing or diaphoretic.  HENT:     Head: Normocephalic and atraumatic.     Jaw: There is normal jaw occlusion.     Salivary Glands: Right salivary gland is not diffusely enlarged or tender. Left salivary gland is not diffusely enlarged or tender.     Right Ear: Hearing and external ear normal.     Left Ear: Hearing and external ear normal.     Nose: Nose normal. No congestion or rhinorrhea.     Mouth/Throat:     Lips: Pink. No lesions.     Mouth: Mucous membranes are moist. No oral lesions or angioedema.     Dentition: No gum lesions.     Tongue: No lesions. Tongue does not deviate from midline.     Palate: No mass and lesions.     Pharynx: Oropharynx is clear. Uvula midline. No uvula swelling.     Tonsils: No tonsillar exudate.  Eyes:     General: Lids are normal. Vision grossly intact. Gaze aligned appropriately. No allergic shiner or scleral icterus.       Right eye: No discharge.        Left eye: No discharge.     Extraocular Movements: Extraocular movements intact.  Conjunctiva/sclera: Conjunctivae normal.     Pupils: Pupils are equal, round, and reactive to light.  Neck:     Trachea: Trachea and phonation normal. No tracheal deviation.  Cardiovascular:     Rate and Rhythm: Regular rhythm. Tachycardia present.     Chest Wall: PMI is not displaced.     Pulses: Normal pulses.          Radial pulses are 2+ on the right side and 2+ on the left side.     Heart  sounds: Normal heart sounds, S1 normal and S2 normal. No murmur heard.    No friction rub. No gallop.  Pulmonary:     Effort: Pulmonary effort is normal. No respiratory distress.     Breath sounds: Normal breath sounds and air entry. No stridor, decreased air movement or transmitted upper airway sounds. No decreased breath sounds, wheezing, rhonchi or rales.     Comments: Spoke full sentences without difficulty; no cough observed in exam room Abdominal:     General: Abdomen is flat.     Palpations: Abdomen is soft.     Tenderness: There is no guarding.  Musculoskeletal:        General: Normal range of motion.     Right hand: No swelling. Normal strength. Normal capillary refill.     Left hand: No swelling. Normal strength. Normal capillary refill.     Cervical back: Normal range of motion and neck supple. No swelling, edema, deformity, erythema, signs of trauma, lacerations, rigidity or crepitus. No pain with movement or muscular tenderness. Normal range of motion.     Thoracic back: No swelling, edema, signs of trauma or lacerations. Normal range of motion.     Right lower leg: No edema.     Left lower leg: No edema.  Lymphadenopathy:     Head:     Right side of head: No submental, submandibular, tonsillar, preauricular, posterior auricular or occipital adenopathy.     Left side of head: No submental, submandibular, tonsillar, preauricular, posterior auricular or occipital adenopathy.     Cervical: No cervical adenopathy.     Right cervical: No superficial, deep or posterior cervical adenopathy.    Left cervical: No superficial, deep or posterior cervical adenopathy.  Skin:    General: Skin is warm and dry.     Capillary Refill: Capillary refill takes less than 2 seconds.     Coloration: Skin is not ashen, cyanotic, jaundiced, mottled, pale or sallow.     Findings: No abrasion, abscess, acne, bruising, burn, ecchymosis, erythema, signs of injury, laceration, lesion, petechiae, rash or  wound.     Nails: There is no clubbing.  Neurological:     General: No focal deficit present.     Mental Status: She is alert and oriented to person, place, and time. Mental status is at baseline.     GCS: GCS eye subscore is 4. GCS verbal subscore is 5. GCS motor subscore is 6.     Cranial Nerves: Cranial nerves 2-12 are intact. No cranial nerve deficit, dysarthria or facial asymmetry.     Motor: Motor function is intact. No weakness, tremor, atrophy, abnormal muscle tone or seizure activity.     Coordination: Coordination is intact. Coordination normal.     Gait: Gait is intact. Gait normal.     Comments: In/out of chair without difficulty; gait sure and steady in clinic; bilateral hand grasp equal 5/5  Psychiatric:        Attention and Perception: Attention and perception  normal.        Mood and Affect: Mood and affect normal.        Speech: Speech normal.        Behavior: Behavior normal. Behavior is cooperative.        Thought Content: Thought content normal.        Cognition and Memory: Cognition and memory normal.        Judgment: Judgment normal.       Patient felt better after 1 16oz bottle water but pulse still elevated over 100.  Patient given another 16 oz bottle of water and completed over 15 minutes.  Pulse decreased under 100 and dizziness almost resolved.  Discussed with patient I recommend she eat lunch early.  She stated she would do so upon returning to her workstation has lunch at workstation.  Continue drinking water.  If worsening symptoms return to clinic for re-evaluation consider POCT glucose.  RN Stone notified.  Patient verbalized understanding information/instructions, agreed with plan of care and had no further questions at this time.  1430 Checked on patient in workcenter feeling better and denied difficulty completing her work duties.  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  Patient had no further questions or concerns at this time.    Assessment & Plan:  A-dizzyness, mennorhagia  P-Discussed most likely volume depletion as improved heart rate/blood pressure with rehydration/oral water intake. Patient stated has OTC pain medication for menstrual cramps at home.  Discussed max dosing ibuprofen 800mg  po q8h prn pain and/or tylenol 1000mg  po q6h prn pain.   Consider POCT glucose/exec panel Hgba1c if symptoms worsening again this afternoon after eating lunch.   If syncope/dyspnea with exertion same day re-evaluation by provider recommended ER/UC.  Avoid skipping meals ensure water at workstation and drinking at least 1 cup per hour while at work as hot weather/increased sweating.  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  Discussed consider follow up with GYN regarding heavy menses/medication trial to decrease flow e.g. OCPs/IUD.  Due for annual labs/no anemia last year or abnormal thyroid/electrolytes/blood sugar.  Exitcare handouts on dizzyness/menorrhagia. Repeat BP/VS in 48 hours with clinic staff.  Discussed clinic closed Wednesday/Saturday/Sunday.   Patient verbalized understanding information/instructions, agreed with plan of care and had no further questions at this time.

## 2022-06-05 NOTE — Progress Notes (Unsigned)
Pt reports being dizzy at work.  Brought to clinic.  Given two 16 oz bottles of water.  Reports she is on day 4 of her menstrual cycle and has heavy flow.    BP 92/66 P 108

## 2022-06-05 NOTE — Patient Instructions (Signed)
Menorrhagia Menorrhagia is a form of abnormal uterine bleeding in which menstrual periods are heavy or last longer than normal. With menorrhagia, the periods may cause enough blood loss and cramping that a woman becomes unable to take part in her usual activities. What are the causes? Common causes of this condition include: Polyps or fibroids. These are noncancerous growths in the uterus. An imbalance of the hormones estrogen and progesterone. Anovulation, which occurs when one of the ovaries does not release an egg during one or more months. A problem with the thyroid gland (hypothyroidism). Side effects of having an intrauterine device (IUD). Side effects of some medicines, such as NSAIDs or blood thinners. A bleeding disorder that stops the blood from clotting normally. In some cases, the cause of this condition is not known. What increases the risk? You are more likely to develop this condition if you have cancer of the uterus. What are the signs or symptoms? Symptoms of this condition include: Routinely having to change your pad or tampon every 1-2 hours because it is soaked. Needing to use pads and tampons at the same time because of heavy bleeding. Needing to wake up to change your pads or tampons during the night. Passing blood clots larger than 1 inch (2.5 cm) in size. Having bleeding that lasts for more than 7 days. Having symptoms of low iron levels (anemia), such as tiredness (fatigue) or shortness of breath. How is this diagnosed? This condition may be diagnosed based on: A physical exam. Your symptoms and menstrual history. Tests, such as: Blood tests to check if you are pregnant or if you have hormonal changes, a bleeding or thyroid disorder, anemia, or other problems. Pap test to check for cancerous changes, infections, or inflammation. Endometrial biopsy. This test involves removing a tissue sample from the lining of the uterus (endometrium) to be examined under a  microscope. Pelvic ultrasound. This test uses sound waves to create images of your uterus, ovaries, and vagina. The images can show if you have fibroids or other growths. Hysteroscopy. For this test, a thin, flexible tube with a light on the end (hysteroscope) is used to look inside your uterus. How is this treated? Treatment may not be needed for this condition. If it is needed, the best treatment for you will depend on: Whether you need to prevent pregnancy. Your desire to have children in the future. The cause and severity of your bleeding. Your personal preference. Medicine Medicines are the first step in treatment. You may be treated with: Hormonal birth control methods. These treatments reduce bleeding during your menstrual period. They include: Birth control pills. Skin patch. Vaginal ring. Shots (injections) that you get every 3 months. Hormonal IUD. Implants that go under the skin. Medicines that thicken the blood and slow bleeding. Medicines that reduce swelling, such as ibuprofen. Medicines that contain an artificial (synthetic) hormone called progestin. Medicines that make the ovaries stop working for a short time. Iron supplements to treat anemia.  Surgery If medicines do not work, surgery may be done. Surgical options may include: Dilation and curettage (D&C). In this procedure, your health care provider opens the lowest part of the uterus (cervix) and then scrapes or suctions tissue from the endometrium. This reduces menstrual bleeding. Operative hysteroscopy. In this procedure, a hysteroscope is used to view your uterus and help remove polyps that may be causing heavy periods. Endometrial ablation. This is when various techniques are used to permanently destroy your entire endometrium. After endometrial ablation, most women have little   or no menstrual flow. This procedure reduces your ability to become pregnant. Endometrial resection. In this procedure, an electrosurgical  wire loop is used to remove the endometrium. This procedure reduces your ability to become pregnant. Hysterectomy. This is surgical removal of your uterus. This is a permanent procedure that stops menstrual periods. Pregnancy is not possible after a hysterectomy. Follow these instructions at home: Medicines Take over-the-counter and prescription medicines only as told by your health care provider. This includes iron pills. Do not change or switch medicines without asking your health care provider. Do not take aspirin or medicines that contain aspirin 1 week before or during your menstrual period. Aspirin may make bleeding worse. Managing constipation Your iron pills may cause constipation. If you are taking prescription iron supplements, you may need to take these actions to prevent or treat constipation: Drink enough fluid to keep your urine pale yellow. Take over-the-counter or prescription medicines. Eat foods that are high in fiber, such as beans, whole grains, and fresh fruits and vegetables. Limit foods that are high in fat and processed sugars, such as fried or sweet foods. General instructions If you need to change your sanitary pad or tampon more than once every 2 hours, limit your activity until the bleeding stops. Eat well-balanced meals, including foods that are high in iron. Foods that have a lot of iron include leafy green vegetables, meat, liver, eggs, and whole-grain breads and cereals. Do not try to lose weight until the abnormal bleeding has stopped and your blood iron level is back to normal. If you need to lose weight, work with your health care provider to lose weight safely. Keep all follow-up visits. This is important. Contact a health care provider if: You soak through a pad or tampon every 1 or 2 hours, and this happens every time you have a period. You need to use pads and tampons at the same time because you are bleeding so much. You have nausea, vomiting, diarrhea, or  other problems related to medicines you are taking. Get help right away if: You soak through more than a pad or tampon in 1 hour. You pass clots bigger than 1 inch (2.5 cm) wide. You feel short of breath. You feel like your heart is beating too fast. You feel dizzy or you faint. You feel very weak or tired. Summary Menorrhagia is a form of abnormal uterine bleeding in which menstrual periods are heavy or last longer than normal. Treatment may not be needed for this condition. If it is needed, it may include medicines or procedures. Take over-the-counter and prescription medicines only as told by your health care provider. This includes iron pills. Get help right away if you have heavy bleeding that soaks through more than a pad or tampon in 1 hour, you pass large clots, or you feel dizzy, short of breath, or very weak or tired. This information is not intended to replace advice given to you by your health care provider. Make sure you discuss any questions you have with your health care provider. Document Revised: 06/14/2020 Document Reviewed: 06/14/2020 Elsevier Patient Education  2023 [employer] Dizziness Dizziness is a common problem. It is a feeling of unsteadiness or light-headedness. You may feel like you are about to faint. Dizziness can lead to injury if you stumble or fall. Anyone can become dizzy, but dizziness is more common in older adults. This condition can be caused by a number of things, including medicines, dehydration, or illness. Follow these instructions at home:  Eating and drinking  Drink enough fluid to keep your urine pale yellow. This helps to keep you from becoming dehydrated. Try to drink more clear fluids, such as water. Do not drink alcohol. Limit your caffeine intake if told to do so by your health care provider. Check ingredients and nutrition facts to see if a food or beverage contains caffeine. Limit your salt (sodium) intake if told to do so by your health  care provider. Check ingredients and nutrition facts to see if a food or beverage contains sodium. Activity  Avoid making quick movements. Rise slowly from chairs and steady yourself until you feel okay. In the morning, first sit up on the side of the bed. When you feel okay, stand slowly while you hold onto something until you know that your balance is good. If you need to stand in one place for a long time, move your legs often. Tighten and relax the muscles in your legs while you are standing. Do not drive or use machinery if you feel dizzy. Avoid bending down if you feel dizzy. Place items in your home so that they are easy for you to reach without leaning over. Lifestyle Do not use any products that contain nicotine or tobacco. These products include cigarettes, chewing tobacco, and vaping devices, such as e-cigarettes. If you need help quitting, ask your health care provider. Try to reduce your stress level by using methods such as yoga or meditation. Talk with your health care provider if you need help to manage your stress. General instructions Watch your dizziness for any changes. Take over-the-counter and prescription medicines only as told by your health care provider. Talk with your health care provider if you think that your dizziness is caused by a medicine that you are taking. Tell a friend or a family member that you are feeling dizzy. If he or she notices any changes in your behavior, have this person call your health care provider. Keep all follow-up visits. This is important. Contact a health care provider if: Your dizziness does not go away or you have new symptoms. Your dizziness or light-headedness gets worse. You feel nauseous. You have reduced hearing. You have a fever. You have neck pain or a stiff neck. Your dizziness leads to an injury or a fall. Get help right away if: You vomit or have diarrhea and are unable to eat or drink anything. You have problems talking,  walking, swallowing, or using your arms, hands, or legs. You feel generally weak. You have any bleeding. You are not thinking clearly or you have trouble forming sentences. It may take a friend or family member to notice this. You have chest pain, abdominal pain, shortness of breath, or sweating. Your vision changes or you develop a severe headache. These symptoms may represent a serious problem that is an emergency. Do not wait to see if the symptoms will go away. Get medical help right away. Call your local emergency services (911 in the [place] Do not drive yourself to the hospital. Summary Dizziness is a feeling of unsteadiness or light-headedness. This condition can be caused by a number of things, including medicines, dehydration, or illness. Anyone can become dizzy, but dizziness is more common in older adults. Drink enough fluid to keep your urine pale yellow. Do not drink alcohol. Avoid making quick movements if you feel dizzy. Monitor your dizziness for any changes. This information is not intended to replace advice given to you by your health care provider. Make sure you  discuss any questions you have with your health care provider. Document Revised: 09/05/2020 Document Reviewed: 09/05/2020 Elsevier Patient Education  [employer].

## 2022-06-13 ENCOUNTER — Telehealth: Payer: Self-pay | Admitting: Registered Nurse

## 2022-06-13 ENCOUNTER — Encounter: Payer: Self-pay | Admitting: Registered Nurse

## 2022-06-13 DIAGNOSIS — R42 Dizziness and giddiness: Secondary | ICD-10-CM

## 2022-06-13 NOTE — Telephone Encounter (Signed)
Patient seen in workcenter today feeling well denied concerns.  Dizziness resolved.  Gait sure and steady in warehouse.  Respirations even and unlabored.  Skin warm dry and pink.  Spoke full sentences without difficulty.

## 2022-09-04 ENCOUNTER — Ambulatory Visit: Payer: No Typology Code available for payment source | Admitting: Occupational Medicine

## 2022-09-04 DIAGNOSIS — Z0000 Encounter for general adult medical examination without abnormal findings: Secondary | ICD-10-CM

## 2022-09-04 NOTE — Progress Notes (Signed)
Patient last period was 07/26/22 took home pregnancy test positive tried messaging PCP no response. Asked to Schedule appt with MD. Schedule appt with Dr. Aaron Mose [facility] doctor Dec 12 th at 1015. Patient got MyChart for appt and Given print out of Location with number and address.

## 2022-09-26 ENCOUNTER — Ambulatory Visit (INDEPENDENT_AMBULATORY_CARE_PROVIDER_SITE_OTHER): Payer: No Typology Code available for payment source

## 2022-09-26 ENCOUNTER — Ambulatory Visit: Payer: No Typology Code available for payment source | Admitting: *Deleted

## 2022-09-26 VITALS — BP 116/69 | HR 81 | Ht 60.0 in | Wt 125.4 lb

## 2022-09-26 DIAGNOSIS — O0990 Supervision of high risk pregnancy, unspecified, unspecified trimester: Secondary | ICD-10-CM

## 2022-09-26 DIAGNOSIS — O3680X0 Pregnancy with inconclusive fetal viability, not applicable or unspecified: Secondary | ICD-10-CM

## 2022-09-26 DIAGNOSIS — Z3A08 8 weeks gestation of pregnancy: Secondary | ICD-10-CM

## 2022-09-26 DIAGNOSIS — O219 Vomiting of pregnancy, unspecified: Secondary | ICD-10-CM

## 2022-09-26 MED ORDER — PROMETHAZINE HCL 25 MG PO TABS: 25.0000 mg | ORAL_TABLET | Freq: Four times a day (QID) | ORAL | 1 refills | Status: DC | PRN

## 2022-09-26 MED ORDER — PRENATAL 27-0.8 MG PO TABS: 1.0000 | ORAL_TABLET | Freq: Every day | ORAL | 0 refills | Status: DC

## 2022-09-26 NOTE — Progress Notes (Signed)
New OB Intake  I connected withNAME@ on 09/26/22 at  8:15 AM EST by In Person Visit and verified that I am speaking with the correct person using two identifiers. Nurse is located at [facility] and pt is located at [place].  I discussed the limitations, risks, security and privacy concerns of performing an evaluation and management service by telephone and the availability of in person appointments. I also discussed with the patient that there may be a patient responsible charge related to this service. The patient expressed understanding and agreed to proceed.  I explained I am completing New OB Intake today. We discussed EDD of 05/02/23 that is based on LMP of 07/26/22. Pt is G2/P1. I reviewed her allergies, medications, Medical/Surgical/OB history, and appropriate screenings. I informed her of [facility] services. [facility] information placed in AVS. Based on history, this is a high risk pregnancy.  Patient Active Problem List   Diagnosis Date Noted   Chronic migraine 12/09/2018   Post concussion syndrome 12/09/2018   Patellofemoral syndrome of left knee 09/09/2018   Low back pain 09/09/2018   Acetabular labrum tear 07/07/2018   Loose body in left hip 07/07/2018   Closed traumatic dislocation of hip (HCC) 05/29/2018    Concerns addressed today  Delivery Plans Plans to deliver at [facility]. Patient given information for [facility] Healthy Baby website for more information about [facility]. Patient is not interested in water birth. Offered upcoming OB visit with CNM to discuss further.  MyChart/Babyscripts MyChart access verified. I explained pt will have some visits in office and some virtually. Babyscripts instructions given and order placed. Patient verifies receipt of registration text/e-mail. Account successfully created and app downloaded.  Blood Pressure Cuff/Weight Scale Blood pressure cuff ordered for patient to pick-up from [employer]. Explained after first prenatal appt pt  will check weekly and document in Babyscripts. Patient does not have weight scale; patient may purchase if they desire to track weight weekly in Babyscripts.  Anatomy US Explained first scheduled [place] will be around 19 weeks. Anatomy US scheduled for 19 wks at MFM. Pt notified to arrive at TBD.  Labs Discussed Avelina Laine genetic screening with patient. Would like both Panorama and Horizon drawn at new OB visit. Routine prenatal labs needed.  COVID Vaccine Patient has had COVID vaccine.   Social Determinants of Health Food Insecurity: Patient denies food insecurity. WIC Referral: Patient is not interested in referral to [facility].  Transportation: Patient denies transportation needs. Childcare: Discussed no children allowed at ultrasound appointments. Offered childcare services; patient declines childcare services at this time.  First visit review I reviewed new OB appt with patient. I explained they will have a provider visit that includes pelvic exam and labs. Explained pt will be seen by Dr. Donavan Foil at first visit; encounter routed to appropriate provider. Explained that patient will be seen by pregnancy navigator following visit with provider.   Harrel Lemon, RN 09/26/2022  8:11 AM

## 2022-10-15 NOTE — L&D Delivery Note (Addendum)
Delivery Note Hailey Ferguson is a 37 y.o. G2P1001 at [redacted]w[redacted]d admitted for IOL for PD.   GBS Status:  Negative/-- (06/27 1630) Maximum Maternal Temperature: 97.9  Labor course: Initial SVE: 2/70/-2. Augmentation with: AROM, Pitocin, Cytotec, and IP Foley. She then progressed to complete.  ROM: 4h 39m with blood tinged fluid  Birth: At 1926 a viable female was delivered via spontaneous vaginal delivery (Presentation: LOA; ). Nuchal cord present: No.  Shoulders and body delivered in usual fashion. Infant placed directly on mom's abdomen for bonding/skin-to-skin, baby dried and stimulated. Cord clamped x 2 after 1 minute and cut by Dad.  Cord blood collected.  The placenta separated spontaneously and delivered via gentle cord traction.  Pitocin infused rapidly IV per protocol.  Fundus firm with massage.  Placenta inspected and appears to be intact with a 3 VC.  Placenta/Cord with the following complications: none .  Cord pH: n/a Sponge and instrument count were correct x2.  Intrapartum complications:  None Anesthesia:  epidural Episiotomy: none Lacerations:  2nd degree Suture Repair: 3.0 vicryl EBL (mL): 198   Infant: APGAR (1 MIN): 9  APGAR (5 MINS): 9  APGAR (10 MINS):    Infant weight: pending  Mom to postpartum.  Baby to Couplet care / Skin to Skin. Placenta to L&D   Plans to Breastfeed Contraception: tubal ligation Circumcision: wants inpatient  Note sent to [facility]: [facility] for pp visit.  Karis Juba, [facility] 05/07/2023 7:57 PM   DELIVERY ATTESTATION I was gloved and present for the delivery in its entirety, and I agree with the above resident/SNM's note.  No cervical polyp noted at delivery, discussed can reassess at pp visit. Plans pp BTL, notified Dr. Donavan Foil, posted for 7/25 @ 1045 (7/24 is full)  Cheral Marker CNM, [facility] 05/07/2023 8:28 PM

## 2022-10-22 ENCOUNTER — Other Ambulatory Visit: Payer: Self-pay | Admitting: Obstetrics and Gynecology

## 2022-10-22 DIAGNOSIS — O0990 Supervision of high risk pregnancy, unspecified, unspecified trimester: Secondary | ICD-10-CM

## 2022-10-24 ENCOUNTER — Encounter: Payer: Self-pay | Admitting: Obstetrics

## 2022-10-24 ENCOUNTER — Other Ambulatory Visit (HOSPITAL_COMMUNITY)
Admission: RE | Admit: 2022-10-24 | Discharge: 2022-10-24 | Disposition: A | Discharge status: Home / Self Care | Payer: No Typology Code available for payment source | Source: Ambulatory Visit | Attending: Obstetrics and Gynecology | Admitting: Obstetrics and Gynecology

## 2022-10-24 ENCOUNTER — Ambulatory Visit (INDEPENDENT_AMBULATORY_CARE_PROVIDER_SITE_OTHER): Payer: No Typology Code available for payment source | Admitting: Obstetrics and Gynecology

## 2022-10-24 ENCOUNTER — Encounter: Payer: Self-pay | Admitting: Obstetrics and Gynecology

## 2022-10-24 VITALS — BP 108/69 | HR 99 | Wt 130.6 lb

## 2022-10-24 DIAGNOSIS — O0991 Supervision of high risk pregnancy, unspecified, first trimester: Secondary | ICD-10-CM | POA: Insufficient documentation

## 2022-10-24 DIAGNOSIS — O09521 Supervision of elderly multigravida, first trimester: Secondary | ICD-10-CM | POA: Diagnosis not present

## 2022-10-24 DIAGNOSIS — N841 Polyp of cervix uteri: Secondary | ICD-10-CM | POA: Insufficient documentation

## 2022-10-24 DIAGNOSIS — Z3A12 12 weeks gestation of pregnancy: Secondary | ICD-10-CM

## 2022-10-24 DIAGNOSIS — O4691 Antepartum hemorrhage, unspecified, first trimester: Secondary | ICD-10-CM

## 2022-10-24 DIAGNOSIS — O4690 Antepartum hemorrhage, unspecified, unspecified trimester: Secondary | ICD-10-CM

## 2022-10-24 DIAGNOSIS — O0990 Supervision of high risk pregnancy, unspecified, unspecified trimester: Secondary | ICD-10-CM

## 2022-10-24 DIAGNOSIS — O09529 Supervision of elderly multigravida, unspecified trimester: Secondary | ICD-10-CM | POA: Insufficient documentation

## 2022-10-24 NOTE — Progress Notes (Addendum)
Patient presents for NOB. Reports vaginal bleeding and blood clots for duration of pregnancy. Endorses some abdominal discomfort. Reports bleeding is a like a menstrual period. Uses menstrual pads daily. Reports nausea and vomiting.  Needs Pap smear today. FHT 158

## 2022-10-24 NOTE — Progress Notes (Signed)
INITIAL PRENATAL VISIT NOTE  Subjective:  Hailey Ferguson is a 37 y.o. G2P1001 at [redacted]w[redacted]d by LMP being seen today for her initial prenatal visit.  She has an obstetric history significant for SVD x 1 13 years ago. She has an uncomplicated medical history.  Patient reports  vaginal bleeding even while pregnant .  Contractions: Irritability. Vag. Bleeding: Moderate.   . Denies leaking of fluid.    Past Medical History:  Diagnosis Date   Back pain 2019   Headache    Post concussive syndrome     Past Surgical History:  Procedure Laterality Date   HIP ARTHROSCOPY Left 07/07/2018   Procedure: Left hip arthroscopic loose body removal;  Surgeon: Nicholes Stairs, MD;  Location: [place];  Service: Orthopedics;  Laterality: Left;  120 mins   HIP CLOSED REDUCTION Left 05/29/2018   Procedure: CLOSED REDUCTION HIP;  Surgeon: Shona Needles, MD;  Location: [place];  Service: Orthopedics;  Laterality: Left;    OB History  Gravida Para Term Preterm AB Living  2 1 1  0 0 1  SAB IAB Ectopic Multiple Live Births  0 0 0 0 1    # Outcome Date GA Lbr Len/2nd Weight Sex Delivery Anes PTL Lv  2 Current           1 Term 06/26/09    M Vag-Spont       Social History   Socioeconomic History   Marital status: Married    Spouse name: Not on file   Number of children: 1   Years of education: completed high school   Highest education level: Not on file  Occupational History   Occupation: retail  Tobacco Use   Smoking status: Never   Smokeless tobacco: Never  Vaping Use   Vaping Use: Never used  Substance and Sexual Activity   Alcohol use: No   Drug use: No   Sexual activity: Yes    Birth control/protection: None  Other Topics Concern   Not on file  Social History Narrative   Lives at home with family.   Left-handed.   2-3 cups tea daily.   Social Determinants of Health   Financial Resource Strain: Not on file  Food Insecurity: Not on file  Transportation Needs: Not on file   Physical Activity: Not on file  Stress: Not on file  Social Connections: Not on file    Family History  Problem Relation Age of Onset   Hypertension Father    Dementia Father    Dementia Mother      Current Outpatient Medications:    benzonatate (TESSALON) 100 MG capsule, Take 1-2 capsules (100-200 mg total) by mouth 3 (three) times daily as needed for cough., Disp: 60 capsule, Rfl: 0   cetirizine (ZYRTEC ALLERGY) 10 MG tablet, Take 1 tablet (10 mg total) by mouth daily. (Patient not taking: Reported on 06/05/2022), Disp: 30 tablet, Rfl: 0   Ibuprofen-Famotidine (DUEXIS) 800-26.6 MG TABS, Take 1 tablet by mouth 3 (three) times daily as needed., Disp: , Rfl:    methocarbamol (ROBAXIN-750) 750 MG tablet, Take 1 tablet by mouth 3 (three) times daily as needed., Disp: , Rfl:    Prenatal Vit-Fe Fumarate-FA (MULTIVITAMIN-PRENATAL) 27-0.8 MG TABS tablet, TAKE 1 TABLET BY MOUTH ONCE DAILY AT  12  NOON, Disp: 30 tablet, Rfl: 0   promethazine (PHENERGAN) 25 MG tablet, Take 1 tablet (25 mg total) by mouth every 6 (six) hours as needed for nausea or vomiting., Disp: 30 tablet,  Rfl: 1   promethazine-dextromethorphan (PROMETHAZINE-DM) 6.25-15 MG/5ML syrup, Take 5 mLs by mouth at bedtime as needed for cough., Disp: 100 mL, Rfl: 0   pseudoephedrine (SUDAFED) 60 MG tablet, Take 1 tablet (60 mg total) by mouth every 8 (eight) hours as needed for congestion., Disp: 30 tablet, Rfl: 0  No Known Allergies  Review of Systems: Negative except for what is mentioned in HPI.  Objective:   Vitals:   10/24/22 0831  BP: 108/69  Pulse: 99  Weight: 130 lb 9.6 oz (59.2 kg)    Fetal Status: Fetal Heart Rate (bpm): 158 (Simultaneous filing. User may not have seen previous data.)         Physical Exam: BP 108/69   Pulse 99   Wt 130 lb 9.6 oz (59.2 kg)   LMP 07/26/2022 (Exact Date)   BMI 25.51 kg/m  CONSTITUTIONAL: Well-developed, well-nourished female in no acute distress.  NEUROLOGIC: Alert and oriented  to person, place, and time. Normal reflexes, muscle tone coordination. No cranial nerve deficit noted. PSYCHIATRIC: Normal mood and affect. Normal behavior. Normal judgment and thought content. SKIN: Skin is warm and dry. No rash noted. Not diaphoretic. No erythema. No pallor. HENT:  Normocephalic, atraumatic, External right and left ear normal. Oropharynx is clear and moist EYES: Conjunctivae and EOM are normal.  NECK: Normal range of motion, supple, no masses CARDIOVASCULAR: Normal heart rate noted, regular rhythm RESPIRATORY: Effort and breath sounds normal, no problems with respiration noted BREASTS: symmetric, non-tender, no masses palpable ABDOMEN: Soft, nontender, nondistended, gravid. GU: normal appearing external female genitalia, multiparous normal appearing cervix, scant white discharge in vagina, large cervical polyp noted extending from the cervical os, friable in appearance.  Pap taken with chaperone MUSCULOSKELETAL: Normal range of motion. EXT:  No edema and no tenderness. 2+ distal pulses.   Assessment and Plan:  Pregnancy: G2P1001 at [redacted]w[redacted]d by LMP  1. [redacted] weeks gestation of pregnancy  - Cytology - PAP( [place]) - CBC/D/Plt+RPR+Rh+ABO+RubIgG... - Cervicovaginal ancillary only( [place]) - Culture, OB Urine - Comp Met (CMET) - HgB A1c - [place] MFM OB DETAIL +14 WK  2. Supervision of high risk pregnancy, antepartum Continue routine prenatal care  - Cytology - PAP( [place]) - CBC/D/Plt+RPR+Rh+ABO+RubIgG... - Cervicovaginal ancillary only( [place]) - Culture, OB Urine - Comp Met (CMET) - HgB A1c - [place] MFM OB DETAIL +14 WK  3. Multigravida of advanced maternal age in first trimester  - Cytology - PAP( [place]) - CBC/D/Plt+RPR+Rh+ABO+RubIgG... - Cervicovaginal ancillary only( [place]) - Culture, OB Urine - Comp Met (CMET) - HgB A1c - [place] MFM OB DETAIL +14 WK  4. Polyp at cervical os Expectant management for now, pt and spouse educated on  lesion and that intercourse may cause bleeding.  If bleeding becomes persistent may consider removal later.  Definitely remove after delivery.   Preterm labor symptoms and general obstetric precautions including but not limited to vaginal bleeding, contractions, leaking of fluid and fetal movement were reviewed in detail with the patient.  Please refer to After Visit Summary for other counseling recommendations.   Return in about 4 weeks (around 11/21/2022) for ROB, in person.  Griffin Basil 10/24/2022 9:36 AM

## 2022-10-25 ENCOUNTER — Telehealth: Payer: Self-pay

## 2022-10-25 ENCOUNTER — Ambulatory Visit: Payer: No Typology Code available for payment source | Admitting: Occupational Medicine

## 2022-10-25 DIAGNOSIS — O4690 Antepartum hemorrhage, unspecified, unspecified trimester: Secondary | ICD-10-CM

## 2022-10-25 DIAGNOSIS — N841 Polyp of cervix uteri: Secondary | ICD-10-CM

## 2022-10-25 LAB — CERVICOVAGINAL ANCILLARY ONLY
Bacterial Vaginitis (gardnerella): POSITIVE — AB
Candida Glabrata: NEGATIVE
Candida Vaginitis: NEGATIVE
Chlamydia: NEGATIVE
Comment: NEGATIVE
Comment: NEGATIVE
Comment: NEGATIVE
Comment: NEGATIVE
Comment: NEGATIVE
Comment: NORMAL
Neisseria Gonorrhea: NEGATIVE
Trichomonas: NEGATIVE

## 2022-10-25 NOTE — Progress Notes (Signed)
Patient and daughter came in for more explanation of diagnosis from [facility] appt yesterday. High risk pregnancy and cervical polyp. Explained and showed pictures to help her understand diagnosis. Also told when to call OB worsening pain and bleeding. Patient wish to be on light duty due to the bleeding. Explained we can't provide that. OB Md could fax to  [place] and we would let HR know.

## 2022-10-25 NOTE — Telephone Encounter (Signed)
Returned call, pt stated that she had more bleeding this afternoon but not really any right now. Advised that per provider notes those symptoms are to be expected with the cervical polyp, can monitor and if symptoms get worse can be evaluated at [facility], pt agreed

## 2022-10-26 ENCOUNTER — Encounter: Payer: Self-pay | Admitting: Obstetrics and Gynecology

## 2022-10-26 ENCOUNTER — Telehealth: Payer: Self-pay

## 2022-10-26 LAB — COMPREHENSIVE METABOLIC PANEL
ALT: 10 IU/L (ref 0–32)
AST: 12 IU/L (ref 0–40)
Albumin/Globulin Ratio: 1.7 (ref 1.2–2.2)
Albumin: 4.1 g/dL (ref 3.9–4.9)
Alkaline Phosphatase: 52 IU/L (ref 44–121)
BUN/Creatinine Ratio: 14 (ref 9–23)
BUN: 8 mg/dL (ref 6–20)
Bilirubin Total: 0.4 mg/dL (ref 0.0–1.2)
CO2: 19 mmol/L — ABNORMAL LOW (ref 20–29)
Calcium: 9 mg/dL (ref 8.7–10.2)
Chloride: 103 mmol/L (ref 96–106)
Creatinine, Ser: 0.56 mg/dL — ABNORMAL LOW (ref 0.57–1.00)
Globulin, Total: 2.4 g/dL (ref 1.5–4.5)
Glucose: 81 mg/dL (ref 70–99)
Potassium: 3.9 mmol/L (ref 3.5–5.2)
Sodium: 138 mmol/L (ref 134–144)
Total Protein: 6.5 g/dL (ref 6.0–8.5)
eGFR: 120 mL/min/{1.73_m2} (ref >59)

## 2022-10-26 LAB — CBC/D/PLT+RPR+RH+ABO+RUBIGG...
Antibody Screen: NEGATIVE
Basophils Absolute: 0 10*3/uL (ref 0.0–0.2)
Basos: 0 %
EOS (ABSOLUTE): 0.1 10*3/uL (ref 0.0–0.4)
Eos: 2 %
HCV Ab: NONREACTIVE
HIV Screen 4th Generation wRfx: NONREACTIVE
Hematocrit: 33.8 % — ABNORMAL LOW (ref 34.0–46.6)
Hemoglobin: 9.8 g/dL — ABNORMAL LOW (ref 11.1–15.9)
Hepatitis B Surface Ag: NEGATIVE
Immature Grans (Abs): 0 10*3/uL (ref 0.0–0.1)
Immature Granulocytes: 0 %
Lymphocytes Absolute: 0.8 10*3/uL (ref 0.7–3.1)
Lymphs: 10 %
MCH: 22.3 pg — ABNORMAL LOW (ref 26.6–33.0)
MCHC: 29 g/dL — ABNORMAL LOW (ref 31.5–35.7)
MCV: 77 fL — ABNORMAL LOW (ref 79–97)
Monocytes Absolute: 0.5 10*3/uL (ref 0.1–0.9)
Monocytes: 6 %
Neutrophils Absolute: 5.9 10*3/uL (ref 1.4–7.0)
Neutrophils: 82 %
Platelets: 282 10*3/uL (ref 150–450)
RBC: 4.4 x10E6/uL (ref 3.77–5.28)
RDW: 26.4 % — ABNORMAL HIGH (ref 11.7–15.4)
RPR Ser Ql: NONREACTIVE
Rh Factor: POSITIVE
Rubella Antibodies, IGG: 4.65 index (ref >0.99)
WBC: 7.3 10*3/uL (ref 3.4–10.8)

## 2022-10-26 LAB — URINE CULTURE, OB REFLEX: Organism ID, Bacteria: NO GROWTH

## 2022-10-26 LAB — HCV INTERPRETATION

## 2022-10-26 LAB — CYTOLOGY - PAP
Comment: NEGATIVE
Diagnosis: NEGATIVE
High risk HPV: NEGATIVE

## 2022-10-26 LAB — HEMOGLOBIN A1C
Est. average glucose Bld gHb Est-mCnc: 94 mg/dL
Hgb A1c MFr Bld: 4.9 % (ref 4.8–5.6)

## 2022-10-26 LAB — CULTURE, OB URINE

## 2022-10-26 MED ORDER — METRONIDAZOLE 500 MG PO TABS: 500.0000 mg | ORAL_TABLET | Freq: Two times a day (BID) | ORAL | 0 refills | Status: DC

## 2022-10-26 NOTE — Telephone Encounter (Signed)
S/w pt and advised of results and rx sent. 

## 2022-11-05 LAB — PANORAMA PRENATAL TEST FULL PANEL:PANORAMA TEST PLUS 5 ADDITIONAL MICRODELETIONS: REPORT SUMMARY: NEGATIVE

## 2022-11-05 LAB — HORIZON CUSTOM: REPORT SUMMARY: NEGATIVE

## 2022-11-07 ENCOUNTER — Encounter: Payer: Self-pay | Admitting: Emergency Medicine

## 2022-11-12 LAB — PANORAMA PRENATAL TEST FULL PANEL:PANORAMA TEST PLUS 5 ADDITIONAL MICRODELETIONS: FETAL FRACTION: 8.3

## 2022-11-19 ENCOUNTER — Other Ambulatory Visit: Payer: Self-pay | Admitting: Obstetrics

## 2022-11-19 DIAGNOSIS — O0990 Supervision of high risk pregnancy, unspecified, unspecified trimester: Secondary | ICD-10-CM

## 2022-11-21 ENCOUNTER — Ambulatory Visit (INDEPENDENT_AMBULATORY_CARE_PROVIDER_SITE_OTHER): Payer: No Typology Code available for payment source | Admitting: Obstetrics and Gynecology

## 2022-11-21 VITALS — BP 106/68 | HR 87 | Wt 130.0 lb

## 2022-11-21 DIAGNOSIS — N841 Polyp of cervix uteri: Secondary | ICD-10-CM

## 2022-11-21 DIAGNOSIS — Z3492 Encounter for supervision of normal pregnancy, unspecified, second trimester: Secondary | ICD-10-CM

## 2022-11-21 DIAGNOSIS — K219 Gastro-esophageal reflux disease without esophagitis: Secondary | ICD-10-CM

## 2022-11-21 DIAGNOSIS — O4690 Antepartum hemorrhage, unspecified, unspecified trimester: Secondary | ICD-10-CM

## 2022-11-21 DIAGNOSIS — O4692 Antepartum hemorrhage, unspecified, second trimester: Secondary | ICD-10-CM

## 2022-11-21 DIAGNOSIS — O219 Vomiting of pregnancy, unspecified: Secondary | ICD-10-CM

## 2022-11-21 DIAGNOSIS — Z3A16 16 weeks gestation of pregnancy: Secondary | ICD-10-CM

## 2022-11-21 MED ORDER — PROMETHAZINE HCL 25 MG PO TABS: 25.0000 mg | ORAL_TABLET | Freq: Four times a day (QID) | ORAL | 1 refills | Status: AC | PRN

## 2022-11-21 MED ORDER — FAMOTIDINE 20 MG PO TABS: 20.0000 mg | ORAL_TABLET | Freq: Two times a day (BID) | ORAL | 3 refills | Status: DC

## 2022-11-21 NOTE — Patient Instructions (Signed)
Tums  Maalox  Mylanta  Zantac  Pepcid  Omeprazole or pantoprazole

## 2022-11-21 NOTE — Progress Notes (Signed)
   PRENATAL VISIT NOTE  Subjective:  Hailey Ferguson is a 37 y.o. G2P1001 at [redacted]w[redacted]d being seen today for ongoing prenatal care.  She is currently monitored for the following issues for this low-risk pregnancy and has Closed traumatic dislocation of hip ([place]); Acetabular labrum tear; Loose body in left hip; Chronic migraine; Post concussion syndrome; Patellofemoral syndrome of left knee; Low back pain; Supervision of high risk pregnancy, antepartum; Advanced maternal age in multigravida; Polyp at cervical os; and Vaginal bleeding during pregnancy, antepartum on their problem list.  Patient reports heartburn.  Contractions: Not present. Vag. Bleeding: None.   . Denies leaking of fluid.   The following portions of the patient's history were reviewed and updated as appropriate: allergies, current medications, past family history, past medical history, past social history, past surgical history and problem list.   Objective:   Vitals:   11/21/22 1533  BP: 106/68  Pulse: 87  Weight: 130 lb (59 kg)    Fetal Status: Fetal Heart Rate (bpm): 145         General:  Alert, oriented and cooperative. Patient is in no acute distress.  Skin: Skin is warm and dry. No rash noted.   Cardiovascular: Normal heart rate noted  Respiratory: Normal respiratory effort, no problems with respiration noted  Abdomen: Soft, gravid, appropriate for gestational age.  Pain/Pressure: Absent      Assessment and Plan:  Pregnancy: G2P1001 at [redacted]w[redacted]d 1. Encounter for supervision of low-risk pregnancy in second trimester 2. [redacted] weeks gestation of pregnancy - AFP, Serum, Open Spina Bifida Anatomy scheduled 2/22  3. Vaginal bleeding during pregnancy, antepartum 4. Polyp at cervical os No further bleeding  5. Gastroesophageal reflux disease, unspecified whether esophagitis present Discussed OTC meds Rx pepcid  Please refer to After Visit Summary for other counseling recommendations.   Return in about 4 weeks (around  12/19/2022) for return OB at 20-22 weeks.  Future Appointments  Date Time Provider [place]  12/06/2022  9:30 AM [facility] NURSE [facility]  12/06/2022  9:45 AM [facility] US4 [facility] [place]    Inez Catalina, MD

## 2022-11-21 NOTE — Progress Notes (Signed)
Pt complains of heartburn, does not have medication.  Also complains of back pain.

## 2022-11-27 LAB — AFP, SERUM, OPEN SPINA BIFIDA
AFP MoM: 0.8
AFP Value: 33.7 ng/mL
Gest. Age on Collection Date: 16.9 weeks
Maternal Age At EDD: 37.5 yr
OSBR Risk 1 IN: 10000
Test Results:: NEGATIVE
Weight: 130 [lb_av]

## 2022-11-28 ENCOUNTER — Ambulatory Visit: Payer: No Typology Code available for payment source | Admitting: Occupational Medicine

## 2022-11-28 DIAGNOSIS — Z0000 Encounter for general adult medical examination without abnormal findings: Secondary | ICD-10-CM

## 2022-11-28 NOTE — Progress Notes (Signed)
[employer] insurance premium discount evaluation:   Patient completed PCM office visit epic reviewed by RN Evlyn Kanner and transcribed. Labs  Tobacco attestation signed. [facility] ROI formed signed. Forms placed in the chart.   Patient given handouts for [place] pharmacies and discount drugs list,MyChart, Tele doc setup, Tele doc Behavioral, [place] counseling and [facility] counseling.  What to do for infectious illness protocol. Given handout for list of medications that can be filled at [facility]. Given Clinic hours and Clinic Email.

## 2022-12-06 ENCOUNTER — Encounter: Payer: Self-pay | Admitting: *Deleted

## 2022-12-06 ENCOUNTER — Ambulatory Visit: Payer: No Typology Code available for payment source | Attending: Obstetrics and Gynecology

## 2022-12-06 ENCOUNTER — Other Ambulatory Visit: Payer: Self-pay | Admitting: *Deleted

## 2022-12-06 ENCOUNTER — Ambulatory Visit: Payer: No Typology Code available for payment source

## 2022-12-06 VITALS — BP 110/56 | HR 72

## 2022-12-06 DIAGNOSIS — Z3A19 19 weeks gestation of pregnancy: Secondary | ICD-10-CM | POA: Diagnosis not present

## 2022-12-06 DIAGNOSIS — O3442 Maternal care for other abnormalities of cervix, second trimester: Secondary | ICD-10-CM

## 2022-12-06 DIAGNOSIS — O0990 Supervision of high risk pregnancy, unspecified, unspecified trimester: Secondary | ICD-10-CM | POA: Diagnosis present

## 2022-12-06 DIAGNOSIS — N841 Polyp of cervix uteri: Secondary | ICD-10-CM | POA: Diagnosis not present

## 2022-12-06 DIAGNOSIS — O09522 Supervision of elderly multigravida, second trimester: Secondary | ICD-10-CM

## 2022-12-15 ENCOUNTER — Other Ambulatory Visit: Payer: Self-pay | Admitting: Obstetrics

## 2022-12-15 DIAGNOSIS — O0990 Supervision of high risk pregnancy, unspecified, unspecified trimester: Secondary | ICD-10-CM

## 2022-12-19 ENCOUNTER — Ambulatory Visit: Payer: No Typology Code available for payment source | Admitting: Family Medicine

## 2022-12-19 VITALS — BP 108/68 | HR 78 | Wt 137.0 lb

## 2022-12-19 DIAGNOSIS — Z3492 Encounter for supervision of normal pregnancy, unspecified, second trimester: Secondary | ICD-10-CM

## 2022-12-19 DIAGNOSIS — Z3A20 20 weeks gestation of pregnancy: Secondary | ICD-10-CM

## 2022-12-19 DIAGNOSIS — N841 Polyp of cervix uteri: Secondary | ICD-10-CM

## 2022-12-19 DIAGNOSIS — O4690 Antepartum hemorrhage, unspecified, unspecified trimester: Secondary | ICD-10-CM

## 2022-12-19 DIAGNOSIS — O4692 Antepartum hemorrhage, unspecified, second trimester: Secondary | ICD-10-CM

## 2022-12-19 NOTE — Progress Notes (Signed)
Pt presents for ROB visit. Pt reports having bleeding for the last 3 days and pressure. No other concerns at this time.

## 2022-12-20 NOTE — Progress Notes (Signed)
   PRENATAL VISIT NOTE  Subjective:  Hailey Ferguson is a 37 y.o. G2P1001 at 29w0dbeing seen today for ongoing prenatal care.  She is currently monitored for the following issues for this low-risk pregnancy and has Closed traumatic dislocation of hip ([place]; Acetabular labrum tear; Loose body in left hip; Chronic migraine; Post concussion syndrome; Patellofemoral syndrome of left knee; Low back pain; Supervision of high risk pregnancy, antepartum; Advanced maternal age in multigravida; Polyp at cervical os; and Vaginal bleeding during pregnancy, antepartum on their problem list.  Patient reports bleeding, no cramping, and no leaking.  Contractions: Not present. Vag. Bleeding: Small.  Movement: Present. Denies leaking of fluid.   The following portions of the patient's history were reviewed and updated as appropriate: allergies, current medications, past family history, past medical history, past social history, past surgical history and problem list.   Objective:   Vitals:   12/19/22 1515  BP: 108/68  Pulse: 78  Weight: 137 lb (62.1 kg)    Fetal Status: Fetal Heart Rate (bpm): 150   Movement: Present     General:  Alert, oriented and cooperative. Patient is in no acute distress.  Skin: Skin is warm and dry. No rash noted.   Cardiovascular: Normal heart rate noted  Respiratory: Normal respiratory effort, no problems with respiration noted  Abdomen: Soft, gravid, appropriate for gestational age.  Pain/Pressure: Present     Pelvic: Cervical exam performed in the presence of a chaperone      speculum exam showing known t cervical polyp.  Cervical polyp is erythematous and appears as if it has been bleeding.  No blood appreciated in vaginal vault.  No blood coming from cervical os.  Extremities: Normal range of motion.  Edema: None  Mental Status: Normal mood and affect. Normal behavior. Normal judgment and thought content.   Assessment and Plan:  Pregnancy: G2P1001 at [redacted]w[redacted]d. [redacted] weeks  gestation of pregnancy Continue routine prenatal care  2. Encounter for supervision of low-risk pregnancy in second trimester Next visit in 4 weeks.  3. Vaginal bleeding during pregnancy, antepartum On exam are reassuring that this is bleeding from cervical polyp.  Discussed strict ED precautions and patient is agreeable.  4. Polyp at cervical os  Preterm labor symptoms and general obstetric precautions including but not limited to vaginal bleeding, contractions, leaking of fluid and fetal movement were reviewed in detail with the patient. Please refer to After Visit Summary for other counseling recommendations.   No follow-ups on file.  Future Appointments  Date Time Provider [place]/12/2022  3:30 PM ErChancy MilroyMD [facility] None  02/07/2023  3:30 [facility]  3:45 PM [facility] US5 [facility]  JoConcepcion LivingMD

## 2023-01-13 ENCOUNTER — Other Ambulatory Visit: Payer: Self-pay | Admitting: Obstetrics

## 2023-01-13 DIAGNOSIS — O0990 Supervision of high risk pregnancy, unspecified, unspecified trimester: Secondary | ICD-10-CM

## 2023-01-16 ENCOUNTER — Ambulatory Visit: Payer: No Typology Code available for payment source | Admitting: Obstetrics and Gynecology

## 2023-01-16 ENCOUNTER — Encounter: Payer: Self-pay | Admitting: Obstetrics and Gynecology

## 2023-01-16 VITALS — BP 102/67 | HR 67 | Wt 139.0 lb

## 2023-01-16 DIAGNOSIS — O0990 Supervision of high risk pregnancy, unspecified, unspecified trimester: Secondary | ICD-10-CM

## 2023-01-16 DIAGNOSIS — O09522 Supervision of elderly multigravida, second trimester: Secondary | ICD-10-CM

## 2023-01-16 DIAGNOSIS — N841 Polyp of cervix uteri: Secondary | ICD-10-CM

## 2023-01-16 DIAGNOSIS — Z3009 Encounter for other general counseling and advice on contraception: Secondary | ICD-10-CM

## 2023-01-16 DIAGNOSIS — O4690 Antepartum hemorrhage, unspecified, unspecified trimester: Secondary | ICD-10-CM

## 2023-01-16 NOTE — Progress Notes (Signed)
Pt states she is having bleeding, states she has cervical polyp. Pt complains of cramping, back pain, HA and swelling.  Restriction letter printed again, pt lifts heavy boxes at work- ?causes more bleeding.

## 2023-01-16 NOTE — Progress Notes (Signed)
Subjective:  Hailey Ferguson is a 37 y.o. G2P1001 at [redacted]w[redacted]d being seen today for ongoing prenatal care.  She is currently monitored for the following issues for this high-risk pregnancy and has Closed traumatic dislocation of hip; Acetabular labrum tear; Loose body in left hip; Chronic migraine; Post concussion syndrome; Patellofemoral syndrome of left knee; Low back pain; Supervision of high risk pregnancy, antepartum; Advanced maternal age in multigravida; Polyp at cervical os; Vaginal bleeding during pregnancy, antepartum; and Unwanted fertility on their problem list.  Patient reports  general discomforts of pregnancy .  Contractions: Irritability.  .  Movement: Present. Denies leaking of fluid.   The following portions of the patient's history were reviewed and updated as appropriate: allergies, current medications, past family history, past medical history, past social history, past surgical history and problem list. Problem list updated.  Objective:   Vitals:   01/16/23 1520  BP: 102/67  Pulse: 67  Weight: 139 lb (63 kg)    Fetal Status: Fetal Heart Rate (bpm): 150   Movement: Present     General:  Alert, oriented and cooperative. Patient is in no acute distress.  Skin: Skin is warm and dry. No rash noted.   Cardiovascular: Normal heart rate noted  Respiratory: Normal respiratory effort, no problems with respiration noted  Abdomen: Soft, gravid, appropriate for gestational age. Pain/Pressure: Present     Pelvic:  Cervical exam performed     Monsel's solution applied   Extremities: Normal range of motion.  Edema: Trace  Mental Status: Normal mood and affect. Normal behavior. Normal judgment and thought content.   Urinalysis:      Assessment and Plan:  Pregnancy: G2P1001 at [redacted]w[redacted]d  1. Supervision of high risk pregnancy, antepartum Stable Glucola next visit  2. Polyp at cervical os Monsel's applied to cervix today Continue with pelvic rest  3. Vaginal bleeding during  pregnancy, antepartum See above  4. Multigravida of advanced maternal age in second trimester Stable  5. Unwanted fertility To sign BTL papers at next visit  Preterm labor symptoms and general obstetric precautions including but not limited to vaginal bleeding, contractions, leaking of fluid and fetal movement were reviewed in detail with the patient. Please refer to After Visit Summary for other counseling recommendations.  Return in about 4 weeks (around 02/13/2023) for OB visit, face to face, any provider, fasting for Glucola.   Chancy Milroy, MD

## 2023-02-07 ENCOUNTER — Ambulatory Visit: Payer: Medicaid Other

## 2023-02-07 ENCOUNTER — Ambulatory Visit: Payer: Medicaid Other | Attending: Maternal & Fetal Medicine

## 2023-02-07 VITALS — BP 108/64 | HR 87

## 2023-02-07 DIAGNOSIS — O0990 Supervision of high risk pregnancy, unspecified, unspecified trimester: Secondary | ICD-10-CM | POA: Diagnosis present

## 2023-02-07 DIAGNOSIS — O09522 Supervision of elderly multigravida, second trimester: Secondary | ICD-10-CM | POA: Diagnosis not present

## 2023-02-07 DIAGNOSIS — N841 Polyp of cervix uteri: Secondary | ICD-10-CM

## 2023-02-07 DIAGNOSIS — O3442 Maternal care for other abnormalities of cervix, second trimester: Secondary | ICD-10-CM | POA: Diagnosis present

## 2023-02-07 DIAGNOSIS — O09523 Supervision of elderly multigravida, third trimester: Secondary | ICD-10-CM

## 2023-02-07 DIAGNOSIS — O35EXX0 Maternal care for other (suspected) fetal abnormality and damage, fetal genitourinary anomalies, not applicable or unspecified: Secondary | ICD-10-CM

## 2023-02-07 DIAGNOSIS — Z3A28 28 weeks gestation of pregnancy: Secondary | ICD-10-CM

## 2023-02-07 DIAGNOSIS — O3443 Maternal care for other abnormalities of cervix, third trimester: Secondary | ICD-10-CM | POA: Diagnosis not present

## 2023-02-08 ENCOUNTER — Other Ambulatory Visit: Payer: Self-pay | Admitting: *Deleted

## 2023-02-08 DIAGNOSIS — O09523 Supervision of elderly multigravida, third trimester: Secondary | ICD-10-CM

## 2023-02-10 ENCOUNTER — Other Ambulatory Visit: Payer: Self-pay | Admitting: Obstetrics

## 2023-02-10 DIAGNOSIS — O0990 Supervision of high risk pregnancy, unspecified, unspecified trimester: Secondary | ICD-10-CM

## 2023-02-14 ENCOUNTER — Other Ambulatory Visit: Payer: No Typology Code available for payment source

## 2023-02-14 ENCOUNTER — Encounter: Payer: Self-pay | Admitting: Obstetrics and Gynecology

## 2023-02-14 ENCOUNTER — Ambulatory Visit (INDEPENDENT_AMBULATORY_CARE_PROVIDER_SITE_OTHER): Payer: Medicaid Other | Admitting: Obstetrics and Gynecology

## 2023-02-14 VITALS — BP 110/67 | HR 74 | Wt 138.0 lb

## 2023-02-14 DIAGNOSIS — O2243 Hemorrhoids in pregnancy, third trimester: Secondary | ICD-10-CM

## 2023-02-14 DIAGNOSIS — R252 Cramp and spasm: Secondary | ICD-10-CM

## 2023-02-14 DIAGNOSIS — O0990 Supervision of high risk pregnancy, unspecified, unspecified trimester: Secondary | ICD-10-CM | POA: Diagnosis not present

## 2023-02-14 DIAGNOSIS — D509 Iron deficiency anemia, unspecified: Secondary | ICD-10-CM

## 2023-02-14 DIAGNOSIS — Z23 Encounter for immunization: Secondary | ICD-10-CM

## 2023-02-14 DIAGNOSIS — O99019 Anemia complicating pregnancy, unspecified trimester: Secondary | ICD-10-CM

## 2023-02-14 DIAGNOSIS — Z3009 Encounter for other general counseling and advice on contraception: Secondary | ICD-10-CM

## 2023-02-14 DIAGNOSIS — Z3A29 29 weeks gestation of pregnancy: Secondary | ICD-10-CM

## 2023-02-14 DIAGNOSIS — O0993 Supervision of high risk pregnancy, unspecified, third trimester: Secondary | ICD-10-CM

## 2023-02-14 DIAGNOSIS — O99891 Other specified diseases and conditions complicating pregnancy: Secondary | ICD-10-CM

## 2023-02-14 MED ORDER — HYDROCORTISONE (PERIANAL) 2.5 % EX CREA: 1.0000 | TOPICAL_CREAM | Freq: Two times a day (BID) | CUTANEOUS | 0 refills | Status: DC

## 2023-02-14 MED ORDER — MAGNESIUM 200 MG PO TABS: 1.0000 | ORAL_TABLET | Freq: Every evening | ORAL | 0 refills | Status: DC | PRN

## 2023-02-14 NOTE — Patient Instructions (Signed)
Fetal Movement Counts Patient Name: ________________________________________________ Patient Due Date: ____________________ What is a fetal movement count?  A fetal movement count is the number of times that you feel your baby move during a certain amount of time. This may also be called a fetal kick count. A fetal movement count is recommended for every pregnant woman. You may be asked to start counting fetal movements as early as week 28 of your pregnancy. Pay attention to when your baby is most active. You may notice your baby's sleep and wake cycles. You may also notice things that make your baby move more. You should do a fetal movement count: When your baby is normally most active. At the same time each day. A good time to count movements is while you are resting, after having something to eat and drink. How do I count fetal movements? Find a quiet, comfortable area. Sit, or lie down on your side. Write down the date, the start time and stop time, and the number of movements that you felt between those two times. Take this information with you to your health care visits. Write down your start time when you feel the first movement. Count kicks, flutters, swishes, rolls, and jabs. You should feel at least 10 movements. You may stop counting after you have felt 10 movements, or if you have been counting for 2 hours. Write down the stop time. If you do not feel 10 movements in 2 hours, contact your health care provider for further instructions. Your health care provider may want to do additional tests to assess your baby's well-being. Contact a health care provider if: You feel fewer than 10 movements in 2 hours. Your baby is not moving like he or she usually does. Date: ____________ Start time: ____________ Stop time: ____________ Movements: ____________ Date: ____________ Start time: ____________ Stop time: ____________ Movements: ____________ Date: ____________ Start time: ____________ Stop  time: ____________ Movements: ____________ Date: ____________ Start time: ____________ Stop time: ____________ Movements: ____________ Date: ____________ Start time: ____________ Stop time: ____________ Movements: ____________ Date: ____________ Start time: ____________ Stop time: ____________ Movements: ____________ Date: ____________ Start time: ____________ Stop time: ____________ Movements: ____________ Date: ____________ Start time: ____________ Stop time: ____________ Movements: ____________ Date: ____________ Start time: ____________ Stop time: ____________ Movements: ____________ This information is not intended to replace advice given to you by your health care provider. Make sure you discuss any questions you have with your health care provider. Document Revised: 05/21/2019 Document Reviewed: 05/21/2019 Elsevier Patient Education  2023 [employer] Iron-Rich Diet  Iron is a mineral that helps your body produce hemoglobin. Hemoglobin is a protein in red blood cells that carries oxygen to your body's tissues. Eating too little iron may cause you to feel weak and tired, and it can increase your risk of infection. Iron is naturally found in many foods, and many foods have iron added to them (are iron-fortified). You may need to follow an iron-rich diet if you do not have enough iron in your body due to certain medical conditions. The amount of iron that you need each day depends on your age, your sex, and any medical conditions you have. Follow instructions from your health care provider or a dietitian about how much iron you should eat each day. What are tips for following this plan? Reading food labels Check food labels to see how many milligrams (mg) of iron are in each serving. Cooking Cook foods in pots and pans that are made from iron. Take these steps   to make it easier for your body to absorb iron from certain foods: Soak beans overnight before cooking. Soak whole grains overnight  and drain them before using. Ferment flours before baking, such as by using yeast in bread dough. Meal planning When you eat foods that contain iron, you should eat them with foods that are high in vitamin C. These include oranges, peppers, tomatoes, potatoes, and mangoes. Vitamin C helps your body absorb iron. Certain foods and drinks prevent your body from absorbing iron properly. Avoid eating these foods in the same meal as iron-rich foods or with iron supplements. These foods include: Coffee, black tea, and red wine. Milk, dairy products, and foods that are high in calcium. Beans and soybeans. Whole grains. General information Take iron supplements only as told by your health care provider. An overdose of iron can be life-threatening. If you were prescribed iron supplements, take them with orange juice or a vitamin C supplement. When you eat iron-fortified foods or take an iron supplement, you should also eat foods that naturally contain iron, such as meat, poultry, and fish. Eating naturally iron-rich foods helps your body absorb the iron that is added to other foods or contained in a supplement. Iron from animal sources is better absorbed than iron from plant sources. What foods should I eat? Fruits Prunes. Raisins. Eat fruits high in vitamin C, such as oranges, grapefruits, and strawberries, with iron-rich foods. Vegetables Spinach (cooked). Green peas. Broccoli. Fermented vegetables. Eat vegetables high in vitamin C, such as leafy greens, potatoes, bell peppers, and tomatoes, with iron-rich foods. Grains Iron-fortified breakfast cereal. Iron-fortified whole-wheat bread. Enriched rice. Sprouted grains. Meats and other proteins Beef liver. Beef. Turkey. Chicken. Oysters. Shrimp. Tuna. Sardines. Chickpeas. Nuts. Tofu. Pumpkin seeds. Beverages Tomato juice. Fresh orange juice. Prune juice. Hibiscus tea. Iron-fortified instant breakfast shakes. Sweets and desserts Blackstrap  molasses. Seasonings and condiments Tahini. Fermented soy sauce. Other foods Wheat germ. The items listed above may not be a complete list of recommended foods and beverages. Contact a dietitian for more information. What foods should I limit? These are foods that should be limited while eating iron-rich foods as they can reduce the absorption of iron in your body. Grains Whole grains. Bran cereal. Bran flour. Meats and other proteins Soybeans. Products made from soy protein. Black beans. Lentils. Mung beans. Split peas. Dairy Milk. Cream. Cheese. Yogurt. Cottage cheese. Beverages Coffee. Black tea. Red wine. Sweets and desserts Cocoa. Chocolate. Ice cream. Seasonings and condiments Basil. Oregano. Large amounts of parsley. The items listed above may not be a complete list of foods and beverages you should limit. Contact a dietitian for more information. Summary Iron is a mineral that helps your body produce hemoglobin. Hemoglobin is a protein in red blood cells that carries oxygen to your body's tissues. Iron is naturally found in many foods, and many foods have iron added to them (are iron-fortified). When you eat foods that contain iron, you should eat them with foods that are high in vitamin C. Vitamin C helps your body absorb iron. Certain foods and drinks prevent your body from absorbing iron properly, such as whole grains and dairy products. You should avoid eating these foods in the same meal as iron-rich foods or with iron supplements. This information is not intended to replace advice given to you by your health care provider. Make sure you discuss any questions you have with your health care provider. Document Revised: 09/12/2020 Document Reviewed: 09/12/2020 Elsevier Patient Education  2023 Elsevier Inc.  

## 2023-02-14 NOTE — Progress Notes (Signed)
Pt presents for ROB visit. Pt c/o hemorrhoids and leg cramps. No other concerns.

## 2023-02-14 NOTE — Addendum Note (Signed)
Addended by: Kenard Gower on: 02/14/2023 09:28 AM   Modules accepted: Orders

## 2023-02-14 NOTE — Progress Notes (Addendum)
HIGH-RISK PREGNANCY OFFICE VISIT Patient name: Hailey Ferguson MRN [id number]  Date of birth: 04-21-1986 Chief Complaint:   No chief complaint on file.  History of Present Illness:   Hailey Ferguson is a 37 y.o. G39P1001 female at [redacted]w[redacted]d with an Estimated Date of Delivery: 05/02/23 being seen today for ongoing management of a high-risk pregnancy complicated by  AMA age 79 yo at delivery, cervical polyp and vaginal bleeding in pregnancy. Today she reports  hemorrhoids and leg cramps . She reports she drinks about 2 liters of water everyday. Contractions: Not present. Vag. Bleeding: None.  Movement: Present. denies leaking of fluid.  Review of Systems:   Pertinent items are noted in HPI Denies abnormal vaginal discharge w/ itching/odor/irritation, headaches, visual changes, shortness of breath, chest pain, abdominal pain, severe nausea/vomiting, or problems with urination or bowel movements unless otherwise stated above. Pertinent History Reviewed:  Reviewed past medical,surgical, social, obstetrical and family history.  Reviewed problem list, medications and allergies. Physical Assessment:   Vitals:   02/14/23 0838  BP: 110/67  Pulse: 74  Weight: 138 lb (62.6 kg)  Body mass index is 26.95 kg/m.           Physical Examination:   General appearance: alert, well appearing, and in no distress, oriented to person, place, and time, and normal appearing weight  Mental status: alert, oriented to person, place, and time, normal mood, behavior, speech, dress, motor activity, and thought processes  Skin: warm & dry   Extremities: Edema: Trace    Cardiovascular: normal heart rate noted  Respiratory: normal respiratory effort, no distress  Abdomen: gravid, soft, non-tender  Pelvic: Cervical exam deferred         Fetal Status: Fetal Heart Rate (bpm): 140 Fundal Height: 28 cm Movement: Present    Fetal Surveillance Testing today: none   No results found for this or any previous visit (from  the past 24 hour(s)).  Assessment & Plan:  1) High-risk pregnancy G2P1001 at [redacted]w[redacted]d with an Estimated Date of Delivery: 05/02/23   2) Supervision of high risk pregnancy, antepartum - Glucose Tolerance, 2 Hours w/1 Hour - HIV antibody (with reflex) - RPR - CBC - Tdap vaccine greater than or equal to 7yo IM - Patient found to be eating a "milk ball" during provider visit - patient advised that she would have to stop taking the GTT today and repeat it at her next visit. Per Dr. Alysia Penna, patient would NOT have to repeat the GTT and could continue with the testing today; despite having a "milk ball" in her mouth after consuming the glucola and prior to the 1 hour blood draw.  3) Unwanted fertility - Dr. Alysia Penna in exam room to counsel patient on BTL - consent signed  4) Hemorrhoids during pregnancy, antepartum, third trimester - Rx: hydrocortisone (ANUSOL-HC) 2.5 % rectal cream; Place 1 Application rectally 2 (two) times daily.  Dispense: 30 g; Refill: 0  5) Leg cramps in pregnancy - Rx: Magnesium 200 MG TABS; Take 1-2 tablets (200-400 mg total) by mouth at bedtime as needed.  Dispense: 30 tablet; Refill: 0   6) [redacted] weeks gestation of pregnancy   Meds: No orders of the defined types were placed in this encounter.   Labs/procedures today: GTT, 3rd trimester labs  Treatment Plan:  continue with current plan  Reviewed: Preterm labor symptoms and general obstetric precautions including but not limited to vaginal bleeding, contractions, leaking of fluid and fetal movement were reviewed in detail with the patient.  All questions were answered. Has home bp cuff. Check bp weekly, let us know if >140/90.   Follow-up: Return in about 2 weeks (around 02/28/2023) for Return OB visit.  Orders Placed This Encounter  Procedures   Tdap vaccine greater than or equal to 7yo IM   Glucose Tolerance, 2 Hours w/1 Hour   HIV antibody (with reflex)   RPR   CBC   Raelyn Mora MSN, CNM 02/14/2023 9:19 AM

## 2023-02-15 LAB — CBC
Hematocrit: 39.8 % (ref 34.0–46.6)
Hemoglobin: 12.7 g/dL (ref 11.1–15.9)
MCH: 28.2 pg (ref 26.6–33.0)
MCHC: 31.9 g/dL (ref 31.5–35.7)
MCV: 88 fL (ref 79–97)
Platelets: 175 10*3/uL (ref 150–450)
RBC: 4.5 x10E6/uL (ref 3.77–5.28)
RDW: 14.5 % (ref 11.7–15.4)
WBC: 7.9 10*3/uL (ref 3.4–10.8)

## 2023-02-15 LAB — GLUCOSE TOLERANCE, 2 HOURS W/ 1HR
Glucose, 1 hour: 221 mg/dL — ABNORMAL HIGH (ref 70–179)
Glucose, 2 hour: 109 mg/dL (ref 70–152)
Glucose, Fasting: 79 mg/dL (ref 70–91)

## 2023-02-15 LAB — RPR: RPR Ser Ql: NONREACTIVE

## 2023-02-15 LAB — HIV ANTIBODY (ROUTINE TESTING W REFLEX): HIV Screen 4th Generation wRfx: NONREACTIVE

## 2023-02-18 ENCOUNTER — Encounter: Payer: Self-pay | Admitting: Obstetrics and Gynecology

## 2023-02-18 MED ORDER — FERROUS SULFATE 325 (65 FE) MG PO TBEC
325.0000 mg | DELAYED_RELEASE_TABLET | ORAL | 2 refills | Status: DC
Start: 2023-02-18 — End: 2024-08-20

## 2023-02-18 MED ORDER — ASCORBIC ACID 500 MG PO TABS
500.0000 mg | ORAL_TABLET | ORAL | 3 refills | Status: DC
Start: 2023-02-18 — End: 2024-08-20

## 2023-02-18 NOTE — Addendum Note (Signed)
Addended by: Kenard Gower on: 02/18/2023 02:37 PM   Modules accepted: Orders

## 2023-02-22 ENCOUNTER — Other Ambulatory Visit: Payer: Medicaid Other

## 2023-02-22 DIAGNOSIS — Z3A30 30 weeks gestation of pregnancy: Secondary | ICD-10-CM

## 2023-02-23 LAB — GLUCOSE TOLERANCE, 2 HOURS W/ 1HR
Glucose, 1 hour: 179 mg/dL (ref 70–179)
Glucose, 2 hour: 130 mg/dL (ref 70–152)
Glucose, Fasting: 84 mg/dL (ref 70–91)

## 2023-02-28 ENCOUNTER — Ambulatory Visit (INDEPENDENT_AMBULATORY_CARE_PROVIDER_SITE_OTHER): Payer: Medicaid Other | Admitting: Obstetrics and Gynecology

## 2023-02-28 VITALS — BP 102/69 | HR 90 | Wt 147.0 lb

## 2023-02-28 DIAGNOSIS — O0990 Supervision of high risk pregnancy, unspecified, unspecified trimester: Secondary | ICD-10-CM

## 2023-02-28 DIAGNOSIS — Z3A31 31 weeks gestation of pregnancy: Secondary | ICD-10-CM

## 2023-02-28 NOTE — Progress Notes (Signed)
Subjective:  Hailey Ferguson is a 37 y.o. G2P1001 at [redacted]w[redacted]d being seen today for ongoing prenatal care.  She is currently monitored for the following issues for this low-risk pregnancy and has Supervision of high risk pregnancy, antepartum; Advanced maternal age in multigravida; Polyp at cervical os; Vaginal bleeding during pregnancy, antepartum; and Unwanted fertility on their problem list.  Patient reports no complaints.  Contractions: Not present. Vag. Bleeding: None.  Movement: Present. Denies leaking of fluid.   The following portions of the patient's history were reviewed and updated as appropriate: allergies, current medications, past family history, past medical history, past social history, past surgical history and problem list. Problem list updated.  Objective:   Vitals:   02/28/23 1558  BP: 102/69  Pulse: 90  Weight: 147 lb (66.7 kg)    Fetal Status: Fetal Heart Rate (bpm): 148   Movement: Present     General:  Alert, oriented and cooperative. Patient is in no acute distress.  Skin: Skin is warm and dry. No rash noted.   Cardiovascular: Normal heart rate noted  Respiratory: Normal respiratory effort, no problems with respiration noted  Abdomen: Soft, gravid, appropriate for gestational age. Pain/Pressure: Present     Pelvic:  Cervical exam deferred        Extremities: Normal range of motion.  Edema: Trace  Mental Status: Normal mood and affect. Normal behavior. Normal judgment and thought content.   Urinalysis:      Assessment and Plan:  Pregnancy: G2P1001 at [redacted]w[redacted]d  1. Supervision of high risk pregnancy, antepartum Stable Growth scan on 03/21/23  Preterm labor symptoms and general obstetric precautions including but not limited to vaginal bleeding, contractions, leaking of fluid and fetal movement were reviewed in detail with the patient. Please refer to After Visit Summary for other counseling recommendations.  Return in about 2 weeks (around 03/14/2023) for OB visit,  face to face, any provider.   Hermina Staggers, MD

## 2023-03-12 ENCOUNTER — Other Ambulatory Visit: Payer: Self-pay | Admitting: Obstetrics and Gynecology

## 2023-03-14 ENCOUNTER — Ambulatory Visit: Payer: Medicaid Other | Admitting: Medical

## 2023-03-14 ENCOUNTER — Encounter: Payer: Self-pay | Admitting: Medical

## 2023-03-14 VITALS — BP 105/69 | HR 80 | Wt 143.4 lb

## 2023-03-14 DIAGNOSIS — O0990 Supervision of high risk pregnancy, unspecified, unspecified trimester: Secondary | ICD-10-CM

## 2023-03-14 DIAGNOSIS — N841 Polyp of cervix uteri: Secondary | ICD-10-CM

## 2023-03-14 DIAGNOSIS — O09523 Supervision of elderly multigravida, third trimester: Secondary | ICD-10-CM

## 2023-03-14 DIAGNOSIS — Z3009 Encounter for other general counseling and advice on contraception: Secondary | ICD-10-CM

## 2023-03-14 DIAGNOSIS — Z3A33 33 weeks gestation of pregnancy: Secondary | ICD-10-CM

## 2023-03-14 DIAGNOSIS — O0993 Supervision of high risk pregnancy, unspecified, third trimester: Secondary | ICD-10-CM

## 2023-03-14 DIAGNOSIS — O219 Vomiting of pregnancy, unspecified: Secondary | ICD-10-CM

## 2023-03-14 MED ORDER — ONDANSETRON HCL 4 MG PO TABS: 4.0000 mg | ORAL_TABLET | Freq: Three times a day (TID) | ORAL | 3 refills | Status: DC | PRN

## 2023-03-14 NOTE — Progress Notes (Signed)
Pt presents for ROB visit. Pt c/o abdominal pain and nausea and vomiting. Requesting new Rx for n/v

## 2023-03-15 NOTE — Progress Notes (Signed)
   PRENATAL VISIT NOTE  Subjective:  Hailey Ferguson is a 37 y.o. G2P1001 at [redacted]w[redacted]d being seen today for ongoing prenatal care.  She is currently monitored for the following issues for this high-risk pregnancy and has Supervision of high risk pregnancy, antepartum; Advanced maternal age in multigravida; Polyp at cervical os; Vaginal bleeding during pregnancy, antepartum; and Unwanted fertility on their problem list.  Patient reports  intermittent lower abdominal pain .  Contractions: Not present. Vag. Bleeding: None.  Movement: Present. Denies leaking of fluid.   The following portions of the patient's history were reviewed and updated as appropriate: allergies, current medications, past family history, past medical history, past social history, past surgical history and problem list.   Objective:   Vitals:   03/14/23 1603  BP: 105/69  Pulse: 80  Weight: 143 lb 6.4 oz (65 kg)    Fetal Status: Fetal Heart Rate (bpm): 141 Fundal Height: 34 cm Movement: Present     General:  Alert, oriented and cooperative. Patient is in no acute distress.  Skin: Skin is warm and dry. No rash noted.   Cardiovascular: Normal heart rate noted  Respiratory: Normal respiratory effort, no problems with respiration noted  Abdomen: Soft, gravid, appropriate for gestational age.  Pain/Pressure: Present     Pelvic: Cervical exam deferred        Extremities: Normal range of motion.  Edema: Trace  Mental Status: Normal mood and affect. Normal behavior. Normal judgment and thought content.   Assessment and Plan:  Pregnancy: G2P1001 at [redacted]w[redacted]d 1. Supervision of high risk pregnancy, antepartum  2. Multigravida of advanced maternal age in third trimester - Growth [place] scheduled 6/6  3. Polyp at cervical os  4. Unwanted fertility - Planning BTL, consent signed previously   5. [redacted] weeks gestation of pregnancy  6. Nausea and vomiting during pregnancy - ondansetron (ZOFRAN) 4 MG tablet; Take 1 tablet (4 mg total)  by mouth every 8 (eight) hours as needed for nausea or vomiting.  Dispense: 20 tablet; Refill: 3  Preterm labor symptoms and general obstetric precautions including but not limited to vaginal bleeding, contractions, leaking of fluid and fetal movement were reviewed in detail with the patient. Please refer to After Visit Summary for other counseling recommendations.   Return in about 2 weeks (around 03/28/2023) for LOB, In-Person, any provider.  Future Appointments  Date Time Provider Department Center  03/21/2023  3:30 PM [facility] NURSE [facility]  03/21/2023  3:45 PM [facility]  03/28/2023  3:50 PM Conan Bowens, MD [facility] None  04/03/2023  3:50 PM Corlis Hove, NP [facility] None  04/11/2023  3:50 PM Corlis Hove, NP [facility] None    Vonzella Nipple, PA-C

## 2023-03-21 ENCOUNTER — Ambulatory Visit: Payer: Medicaid Other | Attending: Maternal & Fetal Medicine

## 2023-03-21 ENCOUNTER — Ambulatory Visit: Payer: Medicaid Other | Admitting: *Deleted

## 2023-03-21 VITALS — BP 99/63 | HR 70

## 2023-03-21 DIAGNOSIS — Z3A34 34 weeks gestation of pregnancy: Secondary | ICD-10-CM

## 2023-03-21 DIAGNOSIS — O09523 Supervision of elderly multigravida, third trimester: Secondary | ICD-10-CM | POA: Insufficient documentation

## 2023-03-21 DIAGNOSIS — O3483 Maternal care for other abnormalities of pelvic organs, third trimester: Secondary | ICD-10-CM | POA: Diagnosis not present

## 2023-03-21 DIAGNOSIS — N841 Polyp of cervix uteri: Secondary | ICD-10-CM

## 2023-03-21 DIAGNOSIS — O3443 Maternal care for other abnormalities of cervix, third trimester: Secondary | ICD-10-CM

## 2023-03-21 DIAGNOSIS — O0990 Supervision of high risk pregnancy, unspecified, unspecified trimester: Secondary | ICD-10-CM | POA: Insufficient documentation

## 2023-03-21 DIAGNOSIS — N838 Other noninflammatory disorders of ovary, fallopian tube and broad ligament: Secondary | ICD-10-CM

## 2023-03-23 ENCOUNTER — Other Ambulatory Visit: Payer: Self-pay | Admitting: Obstetrics

## 2023-03-23 DIAGNOSIS — O0990 Supervision of high risk pregnancy, unspecified, unspecified trimester: Secondary | ICD-10-CM

## 2023-03-28 ENCOUNTER — Encounter: Payer: Self-pay | Admitting: Obstetrics and Gynecology

## 2023-03-28 ENCOUNTER — Ambulatory Visit: Payer: Medicaid Other | Admitting: Obstetrics and Gynecology

## 2023-03-28 VITALS — BP 92/67 | HR 79 | Wt 143.0 lb

## 2023-03-28 DIAGNOSIS — Z3009 Encounter for other general counseling and advice on contraception: Secondary | ICD-10-CM

## 2023-03-28 DIAGNOSIS — Z3A35 35 weeks gestation of pregnancy: Secondary | ICD-10-CM

## 2023-03-28 DIAGNOSIS — O09523 Supervision of elderly multigravida, third trimester: Secondary | ICD-10-CM

## 2023-03-28 DIAGNOSIS — O0990 Supervision of high risk pregnancy, unspecified, unspecified trimester: Secondary | ICD-10-CM

## 2023-03-28 NOTE — Progress Notes (Signed)
ROB, c/o pressure and pain.

## 2023-03-28 NOTE — Progress Notes (Signed)
   PRENATAL VISIT NOTE  Subjective:  Hailey Ferguson is a 37 y.o. G2P1001 at [redacted]w[redacted]d being seen today for ongoing prenatal care.  She is currently monitored for the following issues for this low-risk pregnancy and has Supervision of high risk pregnancy, antepartum; Advanced maternal age in multigravida; Polyp at cervical os; Vaginal bleeding during pregnancy, antepartum; and Unwanted fertility on their problem list.  Patient reports  some tightness in pain in her lower abdomen .  Contractions: Irritability. Vag. Bleeding: None.  Movement: Present. Denies leaking of fluid.   The following portions of the patient's history were reviewed and updated as appropriate: allergies, current medications, past family history, past medical history, past social history, past surgical history and problem list.   Objective:   Vitals:   03/28/23 1528  BP: 92/67  Pulse: 79  Weight: 143 lb (64.9 kg)    Fetal Status:     Movement: Present     General:  Alert, oriented and cooperative. Patient is in no acute distress.  Skin: Skin is warm and dry. No rash noted.   Cardiovascular: Normal heart rate noted  Respiratory: Normal respiratory effort, no problems with respiration noted  Abdomen: Soft, gravid, appropriate for gestational age.  Pain/Pressure: Present     Pelvic: Cervical exam deferred        Extremities: Normal range of motion.  Edema: None  Mental Status: Normal mood and affect. Normal behavior. Normal judgment and thought content.   Assessment and Plan:  Pregnancy: G2P1001 at [redacted]w[redacted]d  1. Supervision of high risk pregnancy, antepartum  2. Multigravida of advanced maternal age in third trimester  3. Unwanted fertility For BTL  4. [redacted] weeks gestation of pregnancy   Preterm labor symptoms and general obstetric precautions including but not limited to vaginal bleeding, contractions, leaking of fluid and fetal movement were reviewed in detail with the patient. Please refer to After Visit  Summary for other counseling recommendations.   Return in about 1 week (around 04/04/2023) for low OB.  Future Appointments  Date Time Provider Department Center  04/03/2023  3:50 PM Corlis Hove, NP [facility] None  04/11/2023  3:50 PM Corlis Hove, NP [facility] None    Conan Bowens, MD

## 2023-03-31 NOTE — Progress Notes (Signed)
Noted patient has had follow up labs 

## 2023-04-03 ENCOUNTER — Encounter: Payer: Self-pay | Admitting: Student

## 2023-04-03 ENCOUNTER — Ambulatory Visit (INDEPENDENT_AMBULATORY_CARE_PROVIDER_SITE_OTHER): Payer: Medicaid Other | Admitting: Student

## 2023-04-03 VITALS — BP 109/72 | HR 86 | Wt 145.3 lb

## 2023-04-03 DIAGNOSIS — O0990 Supervision of high risk pregnancy, unspecified, unspecified trimester: Secondary | ICD-10-CM

## 2023-04-03 DIAGNOSIS — Z3009 Encounter for other general counseling and advice on contraception: Secondary | ICD-10-CM

## 2023-04-03 DIAGNOSIS — O09523 Supervision of elderly multigravida, third trimester: Secondary | ICD-10-CM

## 2023-04-03 DIAGNOSIS — Z3A35 35 weeks gestation of pregnancy: Secondary | ICD-10-CM
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
## 2023-04-03 NOTE — Progress Notes (Signed)
Pt presents for ROB visit. Pt c/o lower abd and back pain.  

## 2023-04-11 ENCOUNTER — Other Ambulatory Visit (HOSPITAL_COMMUNITY)
Admission: RE | Admit: 2023-04-11 | Discharge: 2023-04-11 | Disposition: A | Discharge status: Home / Self Care | Payer: Medicaid Other | Source: Ambulatory Visit | Attending: Student | Admitting: Student

## 2023-04-11 ENCOUNTER — Encounter: Payer: Self-pay | Admitting: Student

## 2023-04-11 ENCOUNTER — Ambulatory Visit (INDEPENDENT_AMBULATORY_CARE_PROVIDER_SITE_OTHER): Payer: Medicaid Other | Admitting: Student

## 2023-04-11 VITALS — BP 113/75 | HR 80 | Wt 145.2 lb

## 2023-04-11 DIAGNOSIS — Z3009 Encounter for other general counseling and advice on contraception: Secondary | ICD-10-CM

## 2023-04-11 DIAGNOSIS — Z3A37 37 weeks gestation of pregnancy: Secondary | ICD-10-CM | POA: Insufficient documentation

## 2023-04-11 DIAGNOSIS — O0990 Supervision of high risk pregnancy, unspecified, unspecified trimester: Secondary | ICD-10-CM | POA: Insufficient documentation

## 2023-04-11 DIAGNOSIS — O0993 Supervision of high risk pregnancy, unspecified, third trimester: Secondary | ICD-10-CM

## 2023-04-11 DIAGNOSIS — N841 Polyp of cervix uteri: Secondary | ICD-10-CM

## 2023-04-11 DIAGNOSIS — O09523 Supervision of elderly multigravida, third trimester: Secondary | ICD-10-CM

## 2023-04-11 NOTE — Progress Notes (Signed)
   PRENATAL VISIT NOTE  Subjective:  Hailey Ferguson is a 37 y.o. G2P1001 at [redacted]w[redacted]d being seen today for ongoing prenatal care.  She is currently monitored for the following issues for this high-risk pregnancy and has Supervision of high risk pregnancy, antepartum; Advanced maternal age in multigravida; Polyp at cervical os; Vaginal bleeding during pregnancy, antepartum; and Unwanted fertility on their problem list.  Patient reports  lower abdominal pain .  Contractions: Not present. Vag. Bleeding: None.  Movement: Present. Denies leaking of fluid.   The following portions of the patient's history were reviewed and updated as appropriate: allergies, current medications, past family history, past medical history, past social history, past surgical history and problem list.   Objective:   Vitals:   04/11/23 1554  BP: 113/75  Pulse: 80  Weight: 145 lb 3.2 oz (65.9 kg)    Fetal Status: Fetal Heart Rate (bpm): 139   Movement: Present     General:  Alert, oriented and cooperative. Patient is in no acute distress.  Skin: Skin is warm and dry. No rash noted.   Cardiovascular: Normal heart rate noted  Respiratory: Normal respiratory effort, no problems with respiration noted  Abdomen: Soft, gravid, appropriate for gestational age.  Pain/Pressure: Present     Pelvic: Cervical exam performed in the presence of a chaperone Dilation: Closed Effacement (%): 10, 20 Station: Ballotable  Extremities: Normal range of motion.  Edema: None  Mental Status: Normal mood and affect. Normal behavior. Normal judgment and thought content.   Assessment and Plan:  Pregnancy: G2P1001 at [redacted]w[redacted]d 1. Supervision of high risk pregnancy, antepartum - frequent and vigorous fetal movement - anticipatory guidance provided - Cervicovaginal ancillary only( [facility]) - Culture, beta strep (group b only)  2. [redacted] weeks gestation of pregnancy - Cervicovaginal ancillary only( [facility]) - Culture, beta strep (group b  only)  3. Multigravida of advanced maternal age in third trimester - no further growth scans indicated    4. Unwanted fertility - BTL consent signed    Term labor symptoms and general obstetric precautions including but not limited to vaginal bleeding, contractions, leaking of fluid and fetal movement were reviewed in detail with the patient. Please refer to After Visit Summary for other counseling recommendations.   Return in about 1 week (around 04/18/2023) for [facility], IN-PERSON.  No future appointments.  Corlis Hove, NP

## 2023-04-11 NOTE — Progress Notes (Signed)
Pt presents for ROB visit. Pt c/o pain lower abd pain.

## 2023-04-15 LAB — CERVICOVAGINAL ANCILLARY ONLY
Chlamydia: NEGATIVE
Comment: NEGATIVE
Comment: NEGATIVE
Comment: NORMAL
Neisseria Gonorrhea: NEGATIVE
Trichomonas: NEGATIVE

## 2023-04-15 LAB — CULTURE, BETA STREP (GROUP B ONLY): Strep Gp B Culture: NEGATIVE

## 2023-04-17 ENCOUNTER — Ambulatory Visit (INDEPENDENT_AMBULATORY_CARE_PROVIDER_SITE_OTHER): Payer: Medicaid Other | Admitting: Obstetrics and Gynecology

## 2023-04-17 VITALS — BP 102/69 | HR 80 | Wt 146.0 lb

## 2023-04-17 DIAGNOSIS — Z3A37 37 weeks gestation of pregnancy: Secondary | ICD-10-CM

## 2023-04-17 DIAGNOSIS — R12 Heartburn: Secondary | ICD-10-CM

## 2023-04-17 DIAGNOSIS — O0990 Supervision of high risk pregnancy, unspecified, unspecified trimester: Secondary | ICD-10-CM

## 2023-04-17 DIAGNOSIS — O09523 Supervision of elderly multigravida, third trimester: Secondary | ICD-10-CM

## 2023-04-17 DIAGNOSIS — O26893 Other specified pregnancy related conditions, third trimester: Secondary | ICD-10-CM

## 2023-04-17 DIAGNOSIS — Z3009 Encounter for other general counseling and advice on contraception: Secondary | ICD-10-CM

## 2023-04-17 NOTE — Patient Instructions (Signed)
Mylanta - liquid for heartburn. Take with meals and before bed

## 2023-04-17 NOTE — Progress Notes (Signed)
ROB, c/o NV, heartburn

## 2023-04-19 NOTE — Progress Notes (Signed)
   PRENATAL VISIT NOTE  Subjective:  Hailey Ferguson is a 37 y.o. G2P1001 at [redacted]w[redacted]d being seen today for ongoing prenatal care.  She is currently monitored for the following issues for this low-risk pregnancy and has Supervision of high risk pregnancy, antepartum; Advanced maternal age in multigravida; Polyp at cervical os; Vaginal bleeding during pregnancy, antepartum; and Unwanted fertility on their problem list.  Patient reports heartburn.  Contractions: Irritability. Vag. Bleeding: None.  Movement: Present. Denies leaking of fluid.   The following portions of the patient's history were reviewed and updated as appropriate: allergies, current medications, past family history, past medical history, past social history, past surgical history and problem list.   Objective:   Vitals:   04/17/23 1547  BP: 102/69  Pulse: 80  Weight: 146 lb (66.2 kg)    Fetal Status: Fetal Heart Rate (bpm): 150 Fundal Height: 37 cm Movement: Present     General:  Alert, oriented and cooperative. Patient is in no acute distress.  Skin: Skin is warm and dry. No rash noted.   Cardiovascular: Normal heart rate noted  Respiratory: Normal respiratory effort, no problems with respiration noted  Abdomen: Soft, gravid, appropriate for gestational age.  Pain/Pressure: Present      Assessment and Plan:  Pregnancy: G2P1001 at [redacted]w[redacted]d 1. Supervision of high risk pregnancy, antepartum 2. [redacted] weeks gestation of pregnancy Discussed options for management of GERD. Continue pepcid BID with mylanta prn  3. Multigravida of advanced maternal age in third trimester LR NIPS, AGA on growth [place] 34 weeks  4. Unwanted fertility BTL papers previously signed  5. Heartburn during pregnancy in third trimester  Term labor symptoms and general obstetric precautions including but not limited to vaginal bleeding, contractions, leaking of fluid and fetal movement were reviewed in detail with the patient.  Please refer to After Visit  Summary for other counseling recommendations.   Return in about 1 week (around 04/24/2023) for ROB at 38 weeks.  Future Appointments  Date Time Provider Department Center  04/25/2023  3:30 PM Warden Fillers, MD [facility] None  05/02/2023  2:50 PM Adam Phenix, MD [facility] None   Lennart Pall, MD

## 2023-04-25 ENCOUNTER — Ambulatory Visit (INDEPENDENT_AMBULATORY_CARE_PROVIDER_SITE_OTHER): Payer: Medicaid Other | Admitting: Obstetrics and Gynecology

## 2023-04-25 VITALS — BP 102/68 | HR 82 | Wt 148.0 lb

## 2023-04-25 DIAGNOSIS — O0993 Supervision of high risk pregnancy, unspecified, third trimester: Secondary | ICD-10-CM

## 2023-04-25 DIAGNOSIS — O09523 Supervision of elderly multigravida, third trimester: Secondary | ICD-10-CM

## 2023-04-25 DIAGNOSIS — Z3A39 39 weeks gestation of pregnancy: Secondary | ICD-10-CM

## 2023-04-25 DIAGNOSIS — N841 Polyp of cervix uteri: Secondary | ICD-10-CM

## 2023-04-25 DIAGNOSIS — Z3490 Encounter for supervision of normal pregnancy, unspecified, unspecified trimester: Secondary | ICD-10-CM

## 2023-04-25 DIAGNOSIS — Z3009 Encounter for other general counseling and advice on contraception: Secondary | ICD-10-CM

## 2023-04-25 DIAGNOSIS — O0990 Supervision of high risk pregnancy, unspecified, unspecified trimester: Secondary | ICD-10-CM

## 2023-04-25 NOTE — Progress Notes (Signed)
   PRENATAL VISIT NOTE  Subjective:  Hailey Ferguson is a 37 y.o. G2P1001 at [redacted]w[redacted]d being seen today for ongoing prenatal care.  She is currently monitored for the following issues for this low-risk pregnancy and has Supervision of high risk pregnancy, antepartum; Advanced maternal age in multigravida; Polyp at cervical os; Vaginal bleeding during pregnancy, antepartum; and Unwanted fertility on their problem list.  Patient doing well with no acute concerns today. She reports  pelvic pressure .  Contractions: Not present. Vag. Bleeding: None.  Movement: Present. Denies leaking of fluid.   The following portions of the patient's history were reviewed and updated as appropriate: allergies, current medications, past family history, past medical history, past social history, past surgical history and problem list. Problem list updated.  Objective:   Vitals:   04/25/23 1532  BP: 102/68  Pulse: 82  Weight: 148 lb (67.1 kg)    Fetal Status: Fetal Heart Rate (bpm): 148 Fundal Height: 39 cm Movement: Present     General:  Alert, oriented and cooperative. Patient is in no acute distress.  Skin: Skin is warm and dry. No rash noted.   Cardiovascular: Normal heart rate noted  Respiratory: Normal respiratory effort, no problems with respiration noted  Abdomen: Soft, gravid, appropriate for gestational age.  Pain/Pressure: Present     Pelvic: Cervical exam performed Dilation: Fingertip Effacement (%): 70 Station: -2  Extremities: Normal range of motion.  Edema: None  Mental Status:  Normal mood and affect. Normal behavior. Normal judgment and thought content.   Assessment and Plan:  Pregnancy: G2P1001 at [redacted]w[redacted]d  1. [redacted] weeks gestation of pregnancy   2. Unwanted fertility Pt desires BTL, consent previously signed  3. Supervision of high risk pregnancy, antepartum Continue routine prenatal care, postdates IOL scheduled, NST at next visit  4. Multigravida of advanced maternal age in third  trimester   5. Polyp at cervical os Possible removal postdelivery or at postpartum visit  Term labor symptoms and general obstetric precautions including but not limited to vaginal bleeding, contractions, leaking of fluid and fetal movement were reviewed in detail with the patient.  Please refer to After Visit Summary for other counseling recommendations.   Return for ROB, in person.   Mariel Aloe, MD Faculty Attending Center for [facility]

## 2023-05-01 ENCOUNTER — Encounter (HOSPITAL_COMMUNITY): Payer: Self-pay | Admitting: Obstetrics and Gynecology

## 2023-05-01 ENCOUNTER — Other Ambulatory Visit: Payer: Self-pay

## 2023-05-01 ENCOUNTER — Telehealth (HOSPITAL_COMMUNITY): Payer: Self-pay | Admitting: *Deleted

## 2023-05-01 ENCOUNTER — Inpatient Hospital Stay (HOSPITAL_COMMUNITY)
Admission: AD | Admit: 2023-05-01 | Discharge: 2023-05-01 | Disposition: A | Discharge status: Home / Self Care | Payer: Medicaid Other | Attending: Obstetrics and Gynecology | Admitting: Obstetrics and Gynecology

## 2023-05-01 ENCOUNTER — Encounter (HOSPITAL_COMMUNITY): Payer: Self-pay | Admitting: *Deleted

## 2023-05-01 DIAGNOSIS — Z3A39 39 weeks gestation of pregnancy: Secondary | ICD-10-CM

## 2023-05-01 DIAGNOSIS — O479 False labor, unspecified: Secondary | ICD-10-CM

## 2023-05-01 DIAGNOSIS — O219 Vomiting of pregnancy, unspecified: Secondary | ICD-10-CM | POA: Diagnosis not present

## 2023-05-01 DIAGNOSIS — O471 False labor at or after 37 completed weeks of gestation: Secondary | ICD-10-CM | POA: Diagnosis not present

## 2023-05-01 MED ORDER — PROMETHAZINE HCL 25 MG PO TABS
25.0000 mg | ORAL_TABLET | Freq: Once | ORAL | Status: AC
  Administered 2023-05-01: 25 mg via ORAL
  Filled 2023-05-01: qty 1

## 2023-05-01 NOTE — Telephone Encounter (Signed)
Preadmission screen  

## 2023-05-01 NOTE — Progress Notes (Signed)
S: Ms. Hailey Ferguson is a 37 y.o. G2P1001 at [redacted]w[redacted]d  who presents to MAU today complaining contractions q 15-30 minutes since 200. She denies vaginal bleeding. She denies LOF. She reports normal fetal movement.    O: BP 113/78 (BP Location: Right Arm)   Pulse 73   Temp 97.6 F (36.4 C) (Oral)   Resp 18   Ht 5' (1.524 m)   Wt 66.9 kg   LMP 07/26/2022 (Exact Date)   SpO2 99%   BMI 28.81 kg/m   Cervical exam:  Dilation: 1.5 Effacement (%): 60 Cervical Position: Posterior Station: -3 Presentation: Vertex Exam by:: Felipa Furnace RN   Fetal Monitoring: Baseline: 135 bpm Variability: Moderate Accelerations: Present Decelerations: Absent Contractions: 2-8 mins irregular   A: SIUP at [redacted]w[redacted]d  False labor Nausea and vomiting  P: -Phenergan -MAU return precation -Routine OB care  Autry-Lott, Laraina Sulton, DO 05/01/2023 4:08 AM

## 2023-05-01 NOTE — MAU Note (Signed)
.  Hailey Ferguson is a 37 y.o. at [redacted]w[redacted]d here in MAU reporting: ctx since 2000 was every 30 minutes, but now every 15 minutes. Denies VB or LOF. +FM.   Onset of complaint: 2000 Pain score: 8 Vitals:   05/01/23 0245  BP: 113/78  Pulse: 73  Resp: 18  Temp: 97.6 F (36.4 C)  SpO2: 99%     FHT:140 Lab orders placed from triage:  labor eval

## 2023-05-02 ENCOUNTER — Ambulatory Visit (INDEPENDENT_AMBULATORY_CARE_PROVIDER_SITE_OTHER): Payer: Medicaid Other | Admitting: Obstetrics & Gynecology

## 2023-05-02 VITALS — BP 104/70 | HR 80 | Wt 150.0 lb

## 2023-05-02 DIAGNOSIS — Z3009 Encounter for other general counseling and advice on contraception: Secondary | ICD-10-CM

## 2023-05-02 DIAGNOSIS — O4690 Antepartum hemorrhage, unspecified, unspecified trimester: Secondary | ICD-10-CM

## 2023-05-02 DIAGNOSIS — O0990 Supervision of high risk pregnancy, unspecified, unspecified trimester: Secondary | ICD-10-CM

## 2023-05-02 MED ORDER — PRENATAL 27-0.8 MG PO TABS
1.0000 | ORAL_TABLET | Freq: Every day | ORAL | 11 refills | Status: DC
Start: 2023-05-02 — End: 2024-08-20

## 2023-05-02 NOTE — Progress Notes (Signed)
   PRENATAL VISIT NOTE  Subjective:  Hailey Ferguson is a 37 y.o. G2P1001 at [redacted]w[redacted]d being seen today for ongoing prenatal care.  She is currently monitored for the following issues for this high-risk pregnancy and has Supervision of high risk pregnancy, antepartum; Advanced maternal age in multigravida; Polyp at cervical os; Vaginal bleeding during pregnancy, antepartum; and Unwanted fertility on their problem list.  Patient reports occasional contractions.  Contractions: Not present. Vag. Bleeding: None.  Movement: Present. Denies leaking of fluid.   The following portions of the patient's history were reviewed and updated as appropriate: allergies, current medications, past family history, past medical history, past social history, past surgical history and problem list.   Objective:   Vitals:   05/02/23 1509  BP: 104/70  Pulse: 80  Weight: 150 lb (68 kg)    Fetal Status:     Movement: Present     General:  Alert, oriented and cooperative. Patient is in no acute distress.  Skin: Skin is warm and dry. No rash noted.   Cardiovascular: Normal heart rate noted  Respiratory: Normal respiratory effort, no problems with respiration noted  Abdomen: Soft, gravid, appropriate for gestational age.  Pain/Pressure: Present     Pelvic: Cervical exam deferred        Extremities: Normal range of motion.  Edema: None  Mental Status: Normal mood and affect. Normal behavior. Normal judgment and thought content.   Assessment and Plan:  Pregnancy: G2P1001 at [redacted]w[redacted]d 1. Supervision of high risk pregnancy, antepartum NST reactive  2. Unwanted fertility   3. Vaginal bleeding during pregnancy, antepartum No change  Preterm labor symptoms and general obstetric precautions including but not limited to vaginal bleeding, contractions, leaking of fluid and fetal movement were reviewed in detail with the patient. Please refer to After Visit Summary for other counseling recommendations.   Return in  about 4 days (around 05/06/2023).  Future Appointments  Date Time Provider Department Center  05/07/2023  6:45 AM [facility] SCHED ROOM [facility] None    Scheryl Darter, MD

## 2023-05-02 NOTE — Progress Notes (Signed)
Pt presents for ROB NST today for post dates Induction scheduled 7/23

## 2023-05-07 ENCOUNTER — Inpatient Hospital Stay (HOSPITAL_COMMUNITY): Payer: Medicaid Other | Admitting: Anesthesiology

## 2023-05-07 ENCOUNTER — Inpatient Hospital Stay (HOSPITAL_COMMUNITY)
Admission: RE | Admit: 2023-05-07 | Discharge: 2023-05-10 | DRG: 798 | Disposition: A | Discharge status: Home / Self Care | Payer: Medicaid Other | Attending: Family Medicine | Admitting: Family Medicine

## 2023-05-07 ENCOUNTER — Inpatient Hospital Stay (HOSPITAL_COMMUNITY): Payer: Medicaid Other

## 2023-05-07 ENCOUNTER — Other Ambulatory Visit: Payer: Self-pay

## 2023-05-07 ENCOUNTER — Encounter (HOSPITAL_COMMUNITY): Payer: Self-pay | Admitting: Obstetrics and Gynecology

## 2023-05-07 DIAGNOSIS — O09523 Supervision of elderly multigravida, third trimester: Secondary | ICD-10-CM

## 2023-05-07 DIAGNOSIS — O480 Post-term pregnancy: Principal | ICD-10-CM | POA: Diagnosis present

## 2023-05-07 DIAGNOSIS — Z3A40 40 weeks gestation of pregnancy: Secondary | ICD-10-CM

## 2023-05-07 DIAGNOSIS — O0990 Supervision of high risk pregnancy, unspecified, unspecified trimester: Secondary | ICD-10-CM

## 2023-05-07 DIAGNOSIS — Z302 Encounter for sterilization: Secondary | ICD-10-CM

## 2023-05-07 DIAGNOSIS — Z3490 Encounter for supervision of normal pregnancy, unspecified, unspecified trimester: Secondary | ICD-10-CM

## 2023-05-07 DIAGNOSIS — Z3009 Encounter for other general counseling and advice on contraception: Secondary | ICD-10-CM | POA: Diagnosis present

## 2023-05-07 DIAGNOSIS — N841 Polyp of cervix uteri: Secondary | ICD-10-CM | POA: Diagnosis present

## 2023-05-07 DIAGNOSIS — O4690 Antepartum hemorrhage, unspecified, unspecified trimester: Secondary | ICD-10-CM | POA: Diagnosis present

## 2023-05-07 DIAGNOSIS — O09529 Supervision of elderly multigravida, unspecified trimester: Secondary | ICD-10-CM

## 2023-05-07 LAB — TYPE AND SCREEN
ABO/RH(D): O POS
Antibody Screen: NEGATIVE

## 2023-05-07 LAB — CBC
HCT: 39.2 % (ref 36.0–46.0)
Hemoglobin: 13 g/dL (ref 12.0–15.0)
MCH: 31 pg (ref 26.0–34.0)
MCHC: 33.2 g/dL (ref 30.0–36.0)
MCV: 93.6 fL (ref 80.0–100.0)
Platelets: 191 10*3/uL (ref 150–400)
RBC: 4.19 MIL/uL (ref 3.87–5.11)
RDW: 14.4 % (ref 11.5–15.5)
WBC: 7.4 10*3/uL (ref 4.0–10.5)
nRBC: 0 % (ref 0.0–0.2)

## 2023-05-07 LAB — SYPHILIS: RPR W/REFLEX TO RPR TITER AND TREPONEMAL ANTIBODIES, TRADITIONAL SCREENING AND DIAGNOSIS ALGORITHM: RPR Ser Ql: NONREACTIVE

## 2023-05-07 MED ORDER — COCONUT OIL OIL: 1.0000 | TOPICAL_OIL | Status: DC | PRN

## 2023-05-07 MED ORDER — LACTATED RINGERS IV SOLN: INTRAVENOUS | Status: DC

## 2023-05-07 MED ORDER — FENTANYL-BUPIVACAINE-NACL 0.5-0.125-0.9 MG/250ML-% EP SOLN
12.0000 mL/h | EPIDURAL | Status: DC | PRN
  Filled 2023-05-07: qty 250

## 2023-05-07 MED ORDER — BUPIVACAINE HCL (PF) 0.25 % IJ SOLN
INTRAMUSCULAR | Status: DC | PRN
  Administered 2023-05-07: .8 mL via INTRATHECAL

## 2023-05-07 MED ORDER — SIMETHICONE 80 MG PO CHEW: 80.0000 mg | CHEWABLE_TABLET | ORAL | Status: DC | PRN

## 2023-05-07 MED ORDER — MISOPROSTOL 200 MCG PO TABS
ORAL_TABLET | ORAL | Status: AC
  Filled 2023-05-07: qty 4

## 2023-05-07 MED ORDER — TETANUS-DIPHTH-ACELL PERTUSSIS 5-2.5-18.5 LF-MCG/0.5 IM SUSY: 0.5000 mL | PREFILLED_SYRINGE | Freq: Once | INTRAMUSCULAR | Status: DC

## 2023-05-07 MED ORDER — PRENATAL MULTIVITAMIN CH
1.0000 | ORAL_TABLET | Freq: Every day | ORAL | Status: DC
  Administered 2023-05-08 – 2023-05-10 (×3): 1 via ORAL
  Filled 2023-05-07 (×3): qty 1

## 2023-05-07 MED ORDER — FENTANYL-BUPIVACAINE-NACL 0.5-0.125-0.9 MG/250ML-% EP SOLN
EPIDURAL | Status: DC | PRN
  Administered 2023-05-07: 12 mL/h via EPIDURAL

## 2023-05-07 MED ORDER — ZOLPIDEM TARTRATE 5 MG PO TABS: 5.0000 mg | ORAL_TABLET | Freq: Every evening | ORAL | Status: DC | PRN

## 2023-05-07 MED ORDER — OXYCODONE-ACETAMINOPHEN 5-325 MG PO TABS: 1.0000 | ORAL_TABLET | ORAL | Status: DC | PRN

## 2023-05-07 MED ORDER — SODIUM CHLORIDE 0.9% FLUSH: 3.0000 mL | INTRAVENOUS | Status: DC | PRN

## 2023-05-07 MED ORDER — ACETAMINOPHEN 325 MG PO TABS
650.0000 mg | ORAL_TABLET | ORAL | Status: DC | PRN
  Administered 2023-05-08 – 2023-05-09 (×3): 650 mg via ORAL
  Filled 2023-05-07 (×3): qty 2

## 2023-05-07 MED ORDER — EPHEDRINE 5 MG/ML INJ: 10.0000 mg | INTRAVENOUS | Status: DC | PRN

## 2023-05-07 MED ORDER — IBUPROFEN 600 MG PO TABS
600.0000 mg | ORAL_TABLET | Freq: Four times a day (QID) | ORAL | Status: DC
  Administered 2023-05-07 – 2023-05-10 (×11): 600 mg via ORAL
  Filled 2023-05-07 (×11): qty 1

## 2023-05-07 MED ORDER — BISACODYL 10 MG RE SUPP: 10.0000 mg | Freq: Every day | RECTAL | Status: DC | PRN

## 2023-05-07 MED ORDER — DIPHENHYDRAMINE HCL 25 MG PO CAPS: 25.0000 mg | ORAL_CAPSULE | Freq: Four times a day (QID) | ORAL | Status: DC | PRN

## 2023-05-07 MED ORDER — ONDANSETRON HCL 4 MG PO TABS: 4.0000 mg | ORAL_TABLET | ORAL | Status: DC | PRN

## 2023-05-07 MED ORDER — PHENYLEPHRINE 80 MCG/ML (10ML) SYRINGE FOR IV PUSH (FOR BLOOD PRESSURE SUPPORT): 80.0000 ug | PREFILLED_SYRINGE | INTRAVENOUS | Status: DC | PRN

## 2023-05-07 MED ORDER — BUPIVACAINE HCL (PF) 0.25 % IJ SOLN
INTRAMUSCULAR | Status: DC | PRN
  Administered 2023-05-07: 8 mL via EPIDURAL

## 2023-05-07 MED ORDER — SENNOSIDES-DOCUSATE SODIUM 8.6-50 MG PO TABS
2.0000 | ORAL_TABLET | Freq: Every day | ORAL | Status: DC
  Administered 2023-05-08 – 2023-05-10 (×3): 2 via ORAL
  Filled 2023-05-07 (×3): qty 2

## 2023-05-07 MED ORDER — DIPHENHYDRAMINE HCL 50 MG/ML IJ SOLN: 12.5000 mg | INTRAMUSCULAR | Status: DC | PRN

## 2023-05-07 MED ORDER — FENTANYL CITRATE (PF) 100 MCG/2ML IJ SOLN
50.0000 ug | INTRAMUSCULAR | Status: DC | PRN
  Administered 2023-05-07: 100 ug via INTRAVENOUS
  Filled 2023-05-07 (×2): qty 2

## 2023-05-07 MED ORDER — SOD CITRATE-CITRIC ACID 500-334 MG/5ML PO SOLN: 30.0000 mL | ORAL | Status: DC | PRN

## 2023-05-07 MED ORDER — MISOPROSTOL 25 MCG QUARTER TABLET
25.0000 ug | ORAL_TABLET | Freq: Once | ORAL | Status: AC
  Administered 2023-05-07: 25 ug via VAGINAL
  Filled 2023-05-07: qty 1

## 2023-05-07 MED ORDER — FENTANYL CITRATE (PF) 100 MCG/2ML IJ SOLN
INTRAMUSCULAR | Status: DC | PRN
  Administered 2023-05-07: 100 ug via EPIDURAL

## 2023-05-07 MED ORDER — OXYTOCIN-SODIUM CHLORIDE 30-0.9 UT/500ML-% IV SOLN
1.0000 m[IU]/min | INTRAVENOUS | Status: DC
  Administered 2023-05-07: 2 m[IU]/min via INTRAVENOUS
  Filled 2023-05-07: qty 500

## 2023-05-07 MED ORDER — OXYTOCIN BOLUS FROM INFUSION
333.0000 mL | Freq: Once | INTRAVENOUS | Status: AC
  Administered 2023-05-07: 333 mL via INTRAVENOUS

## 2023-05-07 MED ORDER — ACETAMINOPHEN 325 MG PO TABS: 650.0000 mg | ORAL_TABLET | ORAL | Status: DC | PRN

## 2023-05-07 MED ORDER — MISOPROSTOL 50MCG HALF TABLET
50.0000 ug | ORAL_TABLET | Freq: Once | ORAL | Status: AC
  Administered 2023-05-07: 50 ug via ORAL
  Filled 2023-05-07: qty 1

## 2023-05-07 MED ORDER — LACTATED RINGERS IV SOLN
500.0000 mL | INTRAVENOUS | Status: DC | PRN
  Administered 2023-05-07: 500 mL via INTRAVENOUS

## 2023-05-07 MED ORDER — TERBUTALINE SULFATE 1 MG/ML IJ SOLN: 0.2500 mg | Freq: Once | INTRAMUSCULAR | Status: DC | PRN

## 2023-05-07 MED ORDER — SODIUM CHLORIDE 0.9 % IV SOLN: 250.0000 mL | INTRAVENOUS | Status: DC | PRN

## 2023-05-07 MED ORDER — LIDOCAINE HCL (PF) 1 % IJ SOLN: INTRAMUSCULAR | Status: DC | PRN

## 2023-05-07 MED ORDER — ONDANSETRON HCL 4 MG/2ML IJ SOLN
4.0000 mg | INTRAMUSCULAR | Status: DC | PRN
  Administered 2023-05-09: 4 mg via INTRAVENOUS
  Filled 2023-05-07: qty 2

## 2023-05-07 MED ORDER — WITCH HAZEL-GLYCERIN EX PADS: 1.0000 | MEDICATED_PAD | CUTANEOUS | Status: DC | PRN

## 2023-05-07 MED ORDER — LACTATED RINGERS IV SOLN
500.0000 mL | Freq: Once | INTRAVENOUS | Status: AC
  Administered 2023-05-07: 500 mL via INTRAVENOUS

## 2023-05-07 MED ORDER — ONDANSETRON HCL 4 MG/2ML IJ SOLN
4.0000 mg | Freq: Four times a day (QID) | INTRAMUSCULAR | Status: DC | PRN
  Administered 2023-05-07 (×2): 4 mg via INTRAVENOUS
  Filled 2023-05-07 (×2): qty 2

## 2023-05-07 MED ORDER — OXYTOCIN-SODIUM CHLORIDE 30-0.9 UT/500ML-% IV SOLN: 2.5000 [IU]/h | INTRAVENOUS | Status: DC

## 2023-05-07 MED ORDER — SODIUM CHLORIDE 0.9% FLUSH
3.0000 mL | Freq: Two times a day (BID) | INTRAVENOUS | Status: DC
  Administered 2023-05-07 – 2023-05-08 (×2): 3 mL via INTRAVENOUS

## 2023-05-07 MED ORDER — MEASLES, MUMPS & RUBELLA VAC IJ SOLR: 0.5000 mL | Freq: Once | INTRAMUSCULAR | Status: DC

## 2023-05-07 MED ORDER — DIBUCAINE (PERIANAL) 1 % EX OINT: 1.0000 | TOPICAL_OINTMENT | CUTANEOUS | Status: DC | PRN

## 2023-05-07 MED ORDER — BENZOCAINE-MENTHOL 20-0.5 % EX AERO
1.0000 | INHALATION_SPRAY | CUTANEOUS | Status: DC | PRN
  Administered 2023-05-07: 1 via TOPICAL
  Filled 2023-05-07: qty 56

## 2023-05-07 MED ORDER — LIDOCAINE HCL (PF) 1 % IJ SOLN: 30.0000 mL | INTRAMUSCULAR | Status: DC | PRN

## 2023-05-07 MED ORDER — LIDOCAINE-EPINEPHRINE (PF) 2 %-1:200000 IJ SOLN
INTRAMUSCULAR | Status: DC | PRN
  Administered 2023-05-07: 5 mL via EPIDURAL

## 2023-05-07 MED ORDER — FLEET ENEMA 7-19 GM/118ML RE ENEM: 1.0000 | ENEMA | Freq: Every day | RECTAL | Status: DC | PRN

## 2023-05-07 NOTE — Progress Notes (Signed)
Minahil Cherisa Brucker is a 37 y.o. G2P1001 at [redacted]w[redacted]d admitted for induction of labor due to Post dates. Due date 05/02/23.  Subjective: - patient coping well with labor, feeling more discomfort in lower back, tried Nitrous but didn't like it - well supported by partner - discussed evaluating cervix and performing AROM. No questions or concerns, patient consented to AROM  Objective: BP 127/62   Pulse 77   Temp (!) 97.5 F (36.4 C) (Oral)   Resp 18   Ht 5' (1.524 m)   Wt 68 kg   LMP 07/26/2022 (Exact Date)   SpO2 98%   BMI 29.29 kg/m  No intake/output data recorded. No intake/output data recorded.  FHT:  FHR: 140 bpm, variability: moderate,  accelerations:  Present,  decelerations:  Absent UC:   regular, every 2 minutes SVE:   Dilation: 8 Effacement (%): 80 Station: 0 Exam by:: Rolan Bucco  Labs: Lab Results  Component Value Date   WBC 7.4 05/07/2023   HGB 13.0 05/07/2023   HCT 39.2 05/07/2023   MCV 93.6 05/07/2023   PLT 191 05/07/2023    Assessment / Plan: Induction of labor due to postterm,  progressing well on pitocin, AROM performed scant blood tinged fluid  Labor: progressing on pitocin Preeclampsia:   none Fetal Wellbeing:  Category I Pain Control:  Nitrous Oxide I/D:   GBS neg Anticipated MOD:  NSVD  Karis Juba, Student-MidWife 05/07/2023, 2:32 PM

## 2023-05-07 NOTE — Lactation Note (Signed)
This note was copied from a baby's chart. Lactation Consultation Note  Patient Name: Boy Raianna Slight [id number] Date: 05/07/2023 Age:37 hours Reason for consult: Initial assessment;Term Mom stated the baby has been BF well. Mom denies painful latch. Baby was on the breast when LC came into rm. Baby in supine position, suggested turn body towards mom to prevent soreness.  Newborn feeding habits, STS, I&O, positioning reviewed. Mom encouraged to feed baby 8-12 times/24 hours and with feeding cues.  Mom doesn't have any questions at this time. Encouraged to call for assistance or questions.   Maternal Data Has patient been taught Hand Expression?: Yes Does the patient have breastfeeding experience prior to this delivery?: Yes How long did the patient breastfeed?: 15 months to her now 35 yr old daughter  Feeding    LATCH Score Latch: Grasps breast easily, tongue down, lips flanged, rhythmical sucking.  Audible Swallowing: None  Type of Nipple: Everted at rest and after stimulation  Comfort (Breast/Nipple): Soft / non-tender  Hold (Positioning): No assistance needed to correctly position infant at breast.  LATCH Score: 8   Lactation Tools Discussed/Used    Interventions Interventions: Breast feeding basics reviewed;Position options;Hand express;LC Services brochure;Breast compression  Discharge    Consult Status Consult Status: Follow-up Date: 05/08/23 Follow-up type: In-patient    [name] 05/07/2023, 11:08 PM

## 2023-05-07 NOTE — H&P (Signed)
OBSTETRIC ADMISSION HISTORY AND PHYSICAL  Hailey Ferguson is a 37 y.o. female G2P1001 with IUP at [redacted]w[redacted]d by LMP presenting for IOL for postdates. She reports +FMs, No LOF, no VB, no blurry vision, headaches or peripheral edema, and RUQ pain.  She plans on breast feeding. She request BTL for birth control. She received her prenatal care at  [facility]    Dating: By LMP --->  Estimated Date of Delivery: 05/02/23  Sono:    @[redacted]w[redacted]d , CWD, normal anatomy, cephalic presentation, fundal placenta, 2322 g, 42% EFW   Prenatal History/Complications: Polyp at cervical os, unwanted fertility  Past Medical History: Past Medical History:  Diagnosis Date   Acetabular labrum tear 07/07/2018   Back pain 2019   Closed traumatic dislocation of hip (HCC) 05/29/2018   Headache    Loose body in left hip 07/07/2018   Low back pain 09/09/2018   Patellofemoral syndrome of left knee 09/09/2018   Post concussion syndrome 12/09/2018   Post concussive syndrome     Past Surgical History: Past Surgical History:  Procedure Laterality Date   HIP ARTHROSCOPY Left 07/07/2018   Procedure: Left hip arthroscopic loose body removal;  Surgeon: Yolonda Kida, MD;  Location: [facility] OR;  Service: Orthopedics;  Laterality: Left;  120 mins   HIP CLOSED REDUCTION Left 05/29/2018   Procedure: CLOSED REDUCTION HIP;  Surgeon: Roby Lofts, MD;  Location: [facility] OR;  Service: Orthopedics;  Laterality: Left;    Obstetrical History: OB History     Gravida  2   Para  1   Term  1   Preterm  0   AB  0   Living  1      SAB  0   IAB  0   Ectopic  0   Multiple  0   Live Births  1           Social History Social History   Socioeconomic History   Marital status: Married    Spouse name: Not on file   Number of children: 1   Years of education: completed high school   Highest education level: Not on file  Occupational History   Occupation: retail  Tobacco Use   Smoking status: Never   Smokeless  tobacco: Never  Vaping Use   Vaping status: Never Used  Substance and Sexual Activity   Alcohol use: No   Drug use: No   Sexual activity: Yes    Birth control/protection: None  Other Topics Concern   Not on file  Social History Narrative   Lives at home with family.   Left-handed.   2-3 cups tea daily.   Social Determinants of Health   Financial Resource Strain: Not on file  Food Insecurity: Not on file  Transportation Needs: Not on file  Physical Activity: Not on file  Stress: Not on file  Social Connections: Not on file    Family History: Family History  Problem Relation Age of Onset   Hypertension Father    Dementia Father    Dementia Mother     Allergies: No Known Allergies  Medications Prior to Admission  Medication Sig Dispense Refill Last Dose   ascorbic acid (VITAMIN C) 500 MG tablet Take 1 tablet (500 mg total) by mouth every other day. Take with iron pill 30 tablet 3    famotidine (PEPCID) 20 MG tablet Take 1 tablet by mouth twice daily 60 tablet 0    ferrous sulfate 325 (65 FE) MG EC tablet Take  1 tablet (325 mg total) by mouth every other day. 30 tablet 2    hydrocortisone (ANUSOL-HC) 2.5 % rectal cream Place 1 Application rectally 2 (two) times daily. 30 g 0    Magnesium 200 MG TABS Take 1-2 tablets (200-400 mg total) by mouth at bedtime as needed. 30 tablet 0    ondansetron (ZOFRAN) 4 MG tablet Take 1 tablet (4 mg total) by mouth every 8 (eight) hours as needed for nausea or vomiting. 20 tablet 3    Prenatal Vit-Fe Fumarate-FA (MULTIVITAMIN-PRENATAL) 27-0.8 MG TABS tablet Take 1 tablet by mouth daily. 30 tablet 11    promethazine (PHENERGAN) 25 MG tablet Take 1 tablet (25 mg total) by mouth every 6 (six) hours as needed for nausea or vomiting. (Patient not taking: Reported on 04/11/2023) 30 tablet 1    promethazine-dextromethorphan (PROMETHAZINE-DM) 6.25-15 MG/5ML syrup Take 5 mLs by mouth at bedtime as needed for cough. (Patient not taking: Reported on  12/06/2022) 100 mL 0    pseudoephedrine (SUDAFED) 60 MG tablet Take 1 tablet (60 mg total) by mouth every 8 (eight) hours as needed for congestion. (Patient not taking: Reported on 12/06/2022) 30 tablet 0      Review of Systems   All systems reviewed and negative except as stated in HPI  Height 5' (1.524 m), weight 68 kg, last menstrual period 07/26/2022. General appearance: alert, cooperative, and appears stated age Lungs: Normal work of breathing Heart: regular rate  Abdomen: soft, non-tender Pelvic: 1.5/70/-2 Presentation: cephalic Fetal monitoringBaseline: 145 bpm, Variability: Good {> 6 bpm), Accelerations: Reactive, and Decelerations: Absent Uterine activity occasional     Prenatal labs: ABO, Rh: O/Positive/-- (01/10 4540) Antibody: Negative (01/10 0951) Rubella: 4.65 (01/10 0951) RPR: Non Reactive (05/02 0815)  HBsAg: Negative (01/10 0951)  HIV: Non Reactive (05/02 0815)  GBS: Negative/-- (06/27 1630)  1 hr Glucola negative Genetic screening low risk female Anatomy US normal  Prenatal Transfer Tool  Maternal Diabetes: No Genetic Screening: Normal Maternal Ultrasounds/Referrals: Normal Fetal Ultrasounds or other Referrals:  None Maternal Substance Abuse:  No Significant Maternal Medications:  None Significant Maternal Lab Results:  Group B Strep negative Number of Prenatal Visits:greater than 3 verified prenatal visits Other Comments:  None  No results found for this or any previous visit (from the past 24 hour(s)).  Patient Active Problem List   Diagnosis Date Noted   Post term pregnancy over 40 weeks 05/07/2023   Unwanted fertility 01/16/2023   Advanced maternal age in multigravida 10/24/2022   Polyp at cervical os 10/24/2022   Vaginal bleeding during pregnancy, antepartum 10/24/2022   Supervision of high risk pregnancy, antepartum 09/26/2022    Assessment/Plan:  Geneal Donte Kary is a 37 y.o. G2P1001 at [redacted]w[redacted]d here for IOL for postdates  #Labor:  Induction started with Cytotec and Foley balloon.  Consider AROM once Foley balloon comes out #Pain: Per patient request #FWB: Category 1 #ID:  GBS negative #MOF: Breast #MOC: BTL #Circ:  no   Celedonio Savage, MD  05/07/2023, 7:19 AM

## 2023-05-07 NOTE — Progress Notes (Signed)
Hailey Ferguson is a 37 y.o. G2P1001 at [redacted]w[redacted]d admitted for induction of labor due to Post dates. Due date 05/02/23.  Subjective: - patient coping well with contractions, breathing and changing positions often - well supported by partner - no concerns or needs at this time  Objective: BP 115/82   Pulse 76   Temp (!) 97.5 F (36.4 C) (Oral)   Resp 18   Ht 5' (1.524 m)   Wt 68 kg   LMP 07/26/2022 (Exact Date)   SpO2 98%   BMI 29.29 kg/m  No intake/output data recorded. No intake/output data recorded.  FHT:  FHR: 140 bpm, variability: moderate,  accelerations:  Present,  decelerations:  Absent UC:   regular, every 1-3 minutes SVE:   Dilation: 6 Effacement (%): 70 Station: 0 Exam by:: Ardeen Jourdain, RN  Labs: Lab Results  Component Value Date   WBC 7.4 05/07/2023   HGB 13.0 05/07/2023   HCT 39.2 05/07/2023   MCV 93.6 05/07/2023   PLT 191 05/07/2023    Assessment / Plan: Induction of labor due to postterm,  progressing well on pitocin  Labor: Progressing on Pitocin, will continue to increase then AROM Preeclampsia:   none Fetal Wellbeing:  Category I Pain Control:  Labor support without medications I/D:   GBS neg Anticipated MOD:  NSVD  Karis Juba, Student-MidWife 05/07/2023, 1:49 PM

## 2023-05-07 NOTE — Progress Notes (Signed)
Hailey Ferguson is a 37 y.o. G2P1001 at [redacted]w[redacted]d admitted for induction of labor due to Post dates. Due date 05/02/23.  Subjective: - patient is starting to get uncomfortable with contractions -well supported by husband - no questions or concerns at this time  Objective: BP 116/70   Pulse 68   Temp 97.7 F (36.5 C) (Oral)   Resp 18   Ht 5' (1.524 m)   Wt 68 kg   LMP 07/26/2022 (Exact Date)   BMI 29.29 kg/m  No intake/output data recorded. No intake/output data recorded.  FHT:  FHR: 140 bpm, variability: moderate,  accelerations:  Present,  decelerations:  Absent UC:   regular, every 3-4 minutes SVE:   Dilation: 4 Effacement (%): 50 Station: -2 Exam by:: Rolan Bucco  Labs: Lab Results  Component Value Date   WBC 7.4 05/07/2023   HGB 13.0 05/07/2023   HCT 39.2 05/07/2023   MCV 93.6 05/07/2023   PLT 191 05/07/2023    Assessment / Plan: IOL for postdates, progressing with Cytotec and foley bulb. Foley bulb out at 1030. Will progress to Pitocin, and AROM when appropriate.  Labor: Progressing normally Preeclampsia:   none Fetal Wellbeing:  Category I Pain Control:  Labor support without medications I/D:   GBS neg Anticipated MOD:  NSVD  Karis Juba, Student-MidWife 05/07/2023, 10:42 AM

## 2023-05-07 NOTE — Progress Notes (Signed)
On admission to [facility] pt received falls precaution education and was educated to use the call bell to get assistance getting out of bed to ambulate when ready to go to the bathroom. Pt and FOB verbalized understanding. Pt up to the bathroom with assistance of FOB when this RN entered the room for her 1hr check. Pt was on the toilet and stated that her left leg still felt heavy and she was unable to walk on it. This RN used the stedy to help the pt back to bed, and pt now resting comfortably in the bed. This RN re-educated the pt to call for assistance from hospital staff when getting out of bed due to her leg still being numb from the epidural. Pt was educated that until her legs had normal sensation and she was able to safely walk, a member of the hospital staff would assist her to prevent a fall. The pt and FOB verbalized understanding.  Jeralyn Bennett RN

## 2023-05-07 NOTE — Anesthesia Procedure Notes (Signed)
Epidural Patient location during procedure: OB Start time: 05/07/2023 3:54 PM End time: 05/07/2023 4:08 PM  Staffing Anesthesiologist: [name], DO Performed: anesthesiologist   Preanesthetic Checklist Completed: patient identified, IV checked, risks and benefits discussed, monitors and equipment checked, pre-op evaluation and timeout performed  Epidural Patient position: sitting Prep: DuraPrep and site prepped and draped Patient monitoring: continuous pulse ox, blood pressure, heart rate and cardiac monitor Approach: midline Location: L3-L4 Injection technique: LOR air  Needle:  Needle type: Tuohy  Needle gauge: 17 G Needle length: 9 cm Needle insertion depth: 4 cm Catheter type: closed end flexible Catheter size: 19 Gauge Catheter at skin depth: 9 cm Test dose: negative  Assessment Sensory level: T8 Events: blood not aspirated, no cerebrospinal fluid, injection not painful, no injection resistance, no paresthesia and negative IV test  Additional Notes Patient identified. Risks/Benefits/Options discussed with patient including but not limited to bleeding, infection, nerve damage, paralysis, failed block, incomplete pain control, headache, blood pressure changes, nausea, vomiting, reactions to medication both or allergic, itching and postpartum back pain. Confirmed with bedside nurse the patient's most recent platelet count. Confirmed with patient that they are not currently taking any anticoagulation, have any bleeding history or any family history of bleeding disorders. Patient expressed understanding and wished to proceed. All questions were answered. Sterile technique was used throughout the entire procedure. Please see nursing notes for vital signs. Test dose was given through epidural catheter and negative prior to continuing to dose epidural or start infusion. Warning signs of high block given to the patient including shortness of breath, tingling/numbness in  hands, complete motor block, or any concerning symptoms with instructions to call for help. Patient was given instructions on fall risk and not to get out of bed. All questions and concerns addressed with instructions to call with any issues or inadequate analgesia.  Reason for block:procedure for pain

## 2023-05-07 NOTE — Anesthesia Preprocedure Evaluation (Signed)
Anesthesia Evaluation  Patient identified by MRN, date of birth, ID band Patient awake    Reviewed: Allergy & Precautions, Patient's Chart, lab work & pertinent test results  Airway Mallampati: II  TM Distance: >3 FB Neck ROM: Full    Dental no notable dental hx.    Pulmonary neg pulmonary ROS   Pulmonary exam normal breath sounds clear to auscultation       Cardiovascular negative cardio ROS Normal cardiovascular exam Rhythm:Regular Rate:Normal     Neuro/Psych  Headaches  negative psych ROS   GI/Hepatic negative GI ROS, Neg liver ROS,,,  Endo/Other  negative endocrine ROS    Renal/GU negative Renal ROS  negative genitourinary   Musculoskeletal negative musculoskeletal ROS (+)    Abdominal   Peds negative pediatric ROS (+)  Hematology negative hematology ROS (+) Hb 13, plt 191   Anesthesia Other Findings   Reproductive/Obstetrics (+) Pregnancy                             Anesthesia Physical Anesthesia Plan  ASA: 2  Anesthesia Plan: Epidural   Post-op Pain Management:    Induction:   PONV Risk Score and Plan: 2  Airway Management Planned: Natural Airway  Additional Equipment: None  Intra-op Plan:   Post-operative Plan:   Informed Consent: I have reviewed the patients History and Physical, chart, labs and discussed the procedure including the risks, benefits and alternatives for the proposed anesthesia with the patient or authorized representative who has indicated his/her understanding and acceptance.       Plan Discussed with:   Anesthesia Plan Comments:        Anesthesia Quick Evaluation

## 2023-05-07 NOTE — Discharge Summary (Signed)
Postpartum Discharge Summary   Patient Name: Hailey Ferguson DOB: [birth year] MRN: [id number]  Date of admission: 05/07/2023 Delivery date:05/07/2023 Delivering provider: Shawna Clamp R Date of discharge: 05/10/23  Admitting diagnosis: Post term pregnancy over 40 weeks [O48.0] Intrauterine pregnancy: [redacted]w[redacted]d     Secondary diagnosis:  Principal Problem:   Post term pregnancy over 40 weeks Active Problems:   Supervision of high risk pregnancy, antepartum   Advanced maternal age in multigravida   Polyp at cervical os   Vaginal bleeding during pregnancy, antepartum   Unwanted fertility  Additional problems: none    Discharge diagnosis: Term Pregnancy Delivered                                              Post partum procedures:postpartum tubal ligation Augmentation: AROM, Pitocin, Cytotec, and IP Foley Complications: None  Hospital course: Induction of Labor With Vaginal Delivery   37 y.o. yo G2P2002 at [redacted]w[redacted]d was admitted to the hospital 05/07/2023 for induction of labor.  Indication for induction: Postdates.  Patient had an labor course complicated bynothing. Membrane Rupture Time/Date: 2:29 PM,05/07/2023  Delivery Method:Vaginal, Spontaneous Episiotomy: None Lacerations:  2nd degree;Perineal Details of delivery can be found in separate delivery note.  Patient had a postpartum course complicated by nothing. Has had a normal PP course. She underwent PPP BTL on 05/09/23 without problems.See OP note for additional information, Patient is discharged home 05/09/23.  Newborn Data: Birth date:05/07/2023 Birth time:7:25 PM Gender:Female Living status:Living Apgars:9 ,9  Weight:3000 g  Magnesium Sulfate received: No BMZ received: No Rhophylac:N/A MMR:N/A T-DaP:Given prenatally Flu: N/A Transfusion:No  Physical exam  Vitals:   05/08/23 1115 05/08/23 2141 05/09/23 0519 05/09/23 0839  BP: (!) 91/55 (!) 86/59 (!) 83/62 (!) 94/59  Pulse: 68 72 65 64  Resp: 18 17 16 17   Temp:  98.2 F (36.8 C) 97.9 F (36.6 C) 97.9 F (36.6 C) 97.8 F (36.6 C)  TempSrc: Oral Oral Oral Oral  SpO2:  100% 99%   Weight:      Height:       General: alert, cooperative, and no distress Lochia: appropriate Uterine Fundus: firm Incision: N/A DVT Evaluation: No evidence of DVT seen on physical exam. Labs: Lab Results  Component Value Date   WBC 7.4 05/07/2023   HGB 13.0 05/07/2023   HCT 39.2 05/07/2023   MCV 93.6 05/07/2023   PLT 191 05/07/2023      Latest Ref Rng & Units 10/24/2022    9:51 AM  CMP  Glucose 70 - 99 mg/dL 81   BUN 6 - 20 mg/dL 8   Creatinine 8.46 - 9.62 mg/dL 9.52   Sodium 841 - 324 mmol/L 138   Potassium 3.5 - 5.2 mmol/L 3.9   Chloride 96 - 106 mmol/L 103   CO2 20 - 29 mmol/L 19   Calcium 8.7 - 10.2 mg/dL 9.0   Total Protein 6.0 - 8.5 g/dL 6.5   Total Bilirubin 0.0 - 1.2 mg/dL 0.4   Alkaline Phos 44 - 121 IU/L 52   AST 0 - 40 IU/L 12   ALT 0 - 32 IU/L 10    Edinburgh Score:    05/09/2023   11:45 AM  Edinburgh Postnatal Depression Scale Screening Tool  I have been able to laugh and see the funny side of things. 3  I have looked forward with enjoyment  to things. 2  I have blamed myself unnecessarily when things went wrong. 1  I have been anxious or worried for no good reason. 0  I have felt scared or panicky for no good reason. 0  Things have been getting on top of me. 1  I have been so unhappy that I have had difficulty sleeping. 3  I have felt sad or miserable. 0  I have been so unhappy that I have been crying. 1  The thought of harming myself has occurred to me. 0  Edinburgh Postnatal Depression Scale Total 11     After visit meds:  Allergies as of 05/09/2023   No Known Allergies      Medication List     STOP taking these medications    promethazine-dextromethorphan 6.25-15 MG/5ML syrup Commonly known as: PROMETHAZINE-DM   pseudoephedrine 60 MG tablet Commonly known as: SUDAFED       TAKE these medications     acetaminophen 325 MG tablet Commonly known as: Tylenol Take 2 tablets (650 mg total) by mouth every 4 (four) hours as needed (for pain scale < 4).   ascorbic acid 500 MG tablet Commonly known as: VITAMIN C Take 1 tablet (500 mg total) by mouth every other day. Take with iron pill   benzocaine-Menthol 20-0.5 % Aero Commonly known as: DERMOPLAST Apply 1 Application topically as needed (perineal discomfort).   coconut oil Oil Apply 1 Application topically as needed.   famotidine 20 MG tablet Commonly known as: PEPCID Take 1 tablet by mouth twice daily   ferrous sulfate 325 (65 FE) MG EC tablet Take 1 tablet (325 mg total) by mouth every other day.   hydrocortisone 2.5 % rectal cream Commonly known as: Anusol-HC Place 1 Application rectally 2 (two) times daily.   ibuprofen 600 MG tablet Commonly known as: ADVIL Take 1 tablet (600 mg total) by mouth every 6 (six) hours.   Magnesium 200 MG Tabs Take 1-2 tablets (200-400 mg total) by mouth at bedtime as needed.   multivitamin-prenatal 27-0.8 MG Tabs tablet Take 1 tablet by mouth daily.   ondansetron 4 MG tablet Commonly known as: Zofran Take 1 tablet (4 mg total) by mouth every 8 (eight) hours as needed for nausea or vomiting.   promethazine 25 MG tablet Commonly known as: PHENERGAN Take 1 tablet (25 mg total) by mouth every 6 (six) hours as needed for nausea or vomiting.         Discharge home in stable condition Infant Feeding: Breast Infant Disposition:home with mother Discharge instruction: per After Visit Summary and Postpartum booklet. Activity: Advance as tolerated. Pelvic rest for 6 weeks.  Diet: routine diet Future Appointments: Future Appointments  Date Time Provider Department Center  06/26/2023 10:55 AM Warden Fillers, MD [facility] None   Follow up Visit: Cheral Marker, CNM  P [facility] Please schedule this patient for PP visit in: 4-6wks Low risk pregnancy complicated by:  nothing Delivery mode:  SVD Anticipated Birth Control:  Plans - postpartum tubal, performed on 7/25 PP Procedures needed: none Schedule Integrated BH visit: no Provider: MD, may need cervical polyp removal  05/09/2023 Nettie Elm, MD

## 2023-05-08 MED ORDER — LACTATED RINGERS IV SOLN: INTRAVENOUS | Status: DC

## 2023-05-08 MED ORDER — METOCLOPRAMIDE HCL 10 MG PO TABS
10.0000 mg | ORAL_TABLET | Freq: Once | ORAL | Status: AC
  Administered 2023-05-09: 10 mg via ORAL
  Filled 2023-05-08: qty 1

## 2023-05-08 MED ORDER — FAMOTIDINE 20 MG PO TABS
40.0000 mg | ORAL_TABLET | Freq: Once | ORAL | Status: AC
  Administered 2023-05-09: 40 mg via ORAL
  Filled 2023-05-08: qty 2

## 2023-05-08 NOTE — Anesthesia Postprocedure Evaluation (Signed)
Anesthesia Post Note  Patient: Hailey Ferguson  Procedure(s) Performed: AN AD HOC LABOR EPIDURAL     Patient location during evaluation: Mother Baby Anesthesia Type: Epidural Level of consciousness: awake and alert and oriented Pain management: satisfactory to patient Vital Signs Assessment: post-procedure vital signs reviewed and stable Respiratory status: respiratory function stable Cardiovascular status: stable Postop Assessment: no headache, no backache, epidural receding, patient able to bend at knees, no signs of nausea or vomiting, adequate PO intake and able to ambulate Anesthetic complications: no   No notable events documented.  Last Vitals:  Vitals:   05/08/23 0220 05/08/23 0557  BP: (!) 87/56 (!) 78/55  Pulse: 73 70  Resp: 16 16  Temp: 36.4 C 36.8 C  SpO2: 99% 99%    Last Pain:  Vitals:   05/08/23 0745  TempSrc:   PainSc: 0-No pain   Pain Goal:                Epidural/Spinal Function Cutaneous sensation: Normal sensation (05/08/23 0745), Patient able to flex knees: Yes (05/08/23 0745), Patient able to lift hips off bed: Yes (05/08/23 0745), Back pain beyond tenderness at insertion site: No (05/08/23 0745), Progressively worsening motor and/or sensory loss: No (05/08/23 0745), Bowel and/or bladder incontinence post epidural: No (05/08/23 0745)  Addis Tuohy

## 2023-05-08 NOTE — Progress Notes (Signed)
Post Partum Day 1 Subjective: Eating, drinking, voiding, ambulating well.  +flatus.  Lochia and pain wnl.  Denies dizziness, lightheadedness, or sob. No complaints.   Objective: Blood pressure (!) 78/55, pulse 70, temperature 98.2 F (36.8 C), temperature source Oral, resp. rate 16, height 5' (1.524 m), weight 68 kg, last menstrual period 07/26/2022, SpO2 99%, unknown if currently breastfeeding.  Physical Exam:  General: alert, cooperative, and no distress Lochia: appropriate Uterine Fundus: firm Incision: n/a DVT Evaluation: No evidence of DVT seen on physical exam. Negative Homan's sign. No cords or calf tenderness. No significant calf/ankle edema.  Recent Labs    05/07/23 0710  HGB 13.0  HCT 39.2    Assessment/Plan: Plan for discharge tomorrow, Breastfeeding, and Lactation consult Declines circumcision BTL tomorrow at 1045 (full today), NPO after midnight   LOS: 1 day   Cheral Marker, CNM 05/08/2023, 7:30 AM

## 2023-05-08 NOTE — Lactation Note (Signed)
This note was copied from a baby's chart. Lactation Consultation Note  Patient Name: Hailey Ferguson Date: 05/08/2023 Age:38 hours Reason for consult: Follow-up assessment;Term Mom stated BF going well. Baby has good out put as well. Mom stated breast getting a little sore but it is OK. Mom is having BTL in am. Denies any questions or concerns about BF. At this time mom stated everything os OK. Encouraged to call for assistance and to see a latch.   Maternal Data    Feeding Mother's Current Feeding Choice: Breast Milk  LATCH Score                    Lactation Tools Discussed/Used    Interventions    Discharge    Consult Status Consult Status: Follow-up Date: 05/09/23 Follow-up type: In-patient    [name] 05/08/2023, 11:36 PM

## 2023-05-09 ENCOUNTER — Encounter (HOSPITAL_COMMUNITY): Payer: Self-pay | Admitting: Family Medicine

## 2023-05-09 ENCOUNTER — Inpatient Hospital Stay (HOSPITAL_COMMUNITY): Payer: Medicaid Other

## 2023-05-09 ENCOUNTER — Other Ambulatory Visit: Payer: Self-pay

## 2023-05-09 ENCOUNTER — Encounter (HOSPITAL_COMMUNITY)
Admission: RE | Disposition: A | Discharge status: Home / Self Care | Payer: Self-pay | Source: Home / Self Care | Attending: Family Medicine

## 2023-05-09 DIAGNOSIS — Z302 Encounter for sterilization: Secondary | ICD-10-CM

## 2023-05-09 HISTORY — PX: TUBAL LIGATION: SHX77

## 2023-05-09 SURGERY — LIGATION, FALLOPIAN TUBE, POSTPARTUM
Anesthesia: Spinal | Laterality: Bilateral

## 2023-05-09 MED ORDER — LIDOCAINE HCL (PF) 1 % IJ SOLN
INTRAMUSCULAR | Status: AC
  Filled 2023-05-09: qty 5

## 2023-05-09 MED ORDER — FENTANYL CITRATE (PF) 100 MCG/2ML IJ SOLN
INTRAMUSCULAR | Status: DC | PRN
  Administered 2023-05-09: 50 ug via INTRAVENOUS
  Administered 2023-05-09 (×2): 100 ug via INTRAVENOUS

## 2023-05-09 MED ORDER — FENTANYL CITRATE (PF) 250 MCG/5ML IJ SOLN
INTRAMUSCULAR | Status: AC
  Filled 2023-05-09: qty 5

## 2023-05-09 MED ORDER — IBUPROFEN 600 MG PO TABS: 600.0000 mg | ORAL_TABLET | Freq: Four times a day (QID) | ORAL | 0 refills | Status: AC

## 2023-05-09 MED ORDER — EPHEDRINE 5 MG/ML INJ
INTRAVENOUS | Status: AC
  Filled 2023-05-09: qty 5

## 2023-05-09 MED ORDER — SUCCINYLCHOLINE CHLORIDE 200 MG/10ML IV SOSY
PREFILLED_SYRINGE | INTRAVENOUS | Status: AC
  Filled 2023-05-09: qty 10

## 2023-05-09 MED ORDER — SUGAMMADEX SODIUM 200 MG/2ML IV SOLN
INTRAVENOUS | Status: DC | PRN
  Administered 2023-05-09: 200 mg via INTRAVENOUS

## 2023-05-09 MED ORDER — ONDANSETRON HCL 4 MG/2ML IJ SOLN
INTRAMUSCULAR | Status: DC | PRN
  Administered 2023-05-09: 4 mg via INTRAVENOUS

## 2023-05-09 MED ORDER — BUPIVACAINE HCL (PF) 0.25 % IJ SOLN
INTRAMUSCULAR | Status: AC
  Filled 2023-05-09: qty 10

## 2023-05-09 MED ORDER — ROCURONIUM BROMIDE 10 MG/ML (PF) SYRINGE
PREFILLED_SYRINGE | INTRAVENOUS | Status: AC
  Filled 2023-05-09: qty 10

## 2023-05-09 MED ORDER — PHENYLEPHRINE 80 MCG/ML (10ML) SYRINGE FOR IV PUSH (FOR BLOOD PRESSURE SUPPORT)
PREFILLED_SYRINGE | INTRAVENOUS | Status: DC | PRN
  Administered 2023-05-09: 160 ug via INTRAVENOUS
  Administered 2023-05-09: 80 ug via INTRAVENOUS

## 2023-05-09 MED ORDER — BENZOCAINE-MENTHOL 20-0.5 % EX AERO: 1.0000 | INHALATION_SPRAY | CUTANEOUS | Status: DC | PRN

## 2023-05-09 MED ORDER — PROPOFOL 10 MG/ML IV BOLUS
INTRAVENOUS | Status: DC | PRN
  Administered 2023-05-09: 200 mg via INTRAVENOUS

## 2023-05-09 MED ORDER — ONDANSETRON HCL 4 MG/2ML IJ SOLN
INTRAMUSCULAR | Status: AC
  Filled 2023-05-09: qty 2

## 2023-05-09 MED ORDER — COCONUT OIL OIL: 1.0000 | TOPICAL_OIL | Status: DC | PRN

## 2023-05-09 MED ORDER — ACETAMINOPHEN 325 MG PO TABS: 650.0000 mg | ORAL_TABLET | ORAL | 1 refills | Status: AC | PRN

## 2023-05-09 MED ORDER — PHENYLEPHRINE HCL-NACL 20-0.9 MG/250ML-% IV SOLN
INTRAVENOUS | Status: AC
  Filled 2023-05-09: qty 250

## 2023-05-09 MED ORDER — ACETAMINOPHEN 10 MG/ML IV SOLN
INTRAVENOUS | Status: DC | PRN
  Administered 2023-05-09: 1000 mg via INTRAVENOUS

## 2023-05-09 MED ORDER — LACTATED RINGERS IV SOLN: INTRAVENOUS | Status: DC | PRN

## 2023-05-09 MED ORDER — FENTANYL CITRATE (PF) 100 MCG/2ML IJ SOLN
INTRAMUSCULAR | Status: AC
  Filled 2023-05-09: qty 2

## 2023-05-09 MED ORDER — SUCCINYLCHOLINE CHLORIDE 200 MG/10ML IV SOSY
PREFILLED_SYRINGE | INTRAVENOUS | Status: DC | PRN
  Administered 2023-05-09: 100 mg via INTRAVENOUS

## 2023-05-09 MED ORDER — LIDOCAINE HCL (CARDIAC) PF 100 MG/5ML IV SOSY
PREFILLED_SYRINGE | INTRAVENOUS | Status: DC | PRN
  Administered 2023-05-09: 60 mg via INTRAVENOUS

## 2023-05-09 MED ORDER — ROCURONIUM BROMIDE 100 MG/10ML IV SOLN
INTRAVENOUS | Status: DC | PRN
  Administered 2023-05-09: 30 mg via INTRAVENOUS

## 2023-05-09 MED ORDER — DEXAMETHASONE SODIUM PHOSPHATE 10 MG/ML IJ SOLN
INTRAMUSCULAR | Status: DC | PRN
  Administered 2023-05-09: 4 mg via INTRAVENOUS

## 2023-05-09 MED ORDER — PROPOFOL 10 MG/ML IV BOLUS
INTRAVENOUS | Status: AC
  Filled 2023-05-09: qty 20

## 2023-05-09 SURGICAL SUPPLY — 19 items
BLADE SURG 11 STRL SS (BLADE) ×1 IMPLANT
CLIP FILSHIE TUBAL LIGA STRL (Clip) ×1 IMPLANT
CLOTH BEACON ORANGE TIMEOUT ST (SAFETY) ×1 IMPLANT
DRSG OPSITE POSTOP 3X4 (GAUZE/BANDAGES/DRESSINGS) ×1 IMPLANT
DURAPREP 26ML APPLICATOR (WOUND CARE) ×1 IMPLANT
GLOVE BIOGEL PI IND STRL 7.0 (GLOVE) ×3 IMPLANT
GLOVE ECLIPSE 7.0 STRL STRAW (GLOVE) ×1 IMPLANT
GOWN STRL REUS W/TWL LRG LVL3 (GOWN DISPOSABLE) ×2 IMPLANT
NEEDLE HYPO 22GX1.5 SAFETY (NEEDLE) ×1 IMPLANT
NS IRRIG 1000ML POUR BTL (IV SOLUTION) ×1 IMPLANT
PACK ABDOMINAL MINOR (CUSTOM PROCEDURE TRAY) ×1 IMPLANT
PROTECTOR NERVE ULNAR (MISCELLANEOUS) ×1 IMPLANT
SPONGE LAP 4X18 RFD (DISPOSABLE) IMPLANT
SUT VICRYL 0 UR6 27IN ABS (SUTURE) ×1 IMPLANT
SUT VICRYL 4-0 PS2 18IN ABS (SUTURE) ×1 IMPLANT
SYR CONTROL 10ML LL (SYRINGE) ×1 IMPLANT
TOWEL OR 17X24 6PK STRL BLUE (TOWEL DISPOSABLE) ×2 IMPLANT
TRAY FOLEY CATH SILVER 14FR (SET/KITS/TRAYS/PACK) ×1 IMPLANT
WATER STERILE IRR 1000ML POUR (IV SOLUTION) ×1 IMPLANT

## 2023-05-09 NOTE — Anesthesia Postprocedure Evaluation (Signed)
Anesthesia Post Note  Patient: Hailey Ferguson  Procedure(s) Performed: POST PARTUM TUBAL LIGATION (Bilateral)     Patient location during evaluation: PACU Anesthesia Type: General Level of consciousness: awake and alert, oriented and patient cooperative Pain management: pain level controlled Vital Signs Assessment: post-procedure vital signs reviewed and stable Respiratory status: spontaneous breathing, nonlabored ventilation and respiratory function stable Cardiovascular status: blood pressure returned to baseline and stable Postop Assessment: no apparent nausea or vomiting Anesthetic complications: no   No notable events documented.  Last Vitals:  Vitals:   05/09/23 1635 05/09/23 1723  BP: 111/68 100/63  Pulse: 76 72  Resp: 20 20  Temp: 36.7 C 36.7 C  SpO2: 96% 98%    Last Pain:  Vitals:   05/09/23 1723  TempSrc: Tympanic  PainSc:    Pain Goal:                   [name]

## 2023-05-09 NOTE — Progress Notes (Addendum)
Postpartum tubal consent:  37 y.o. [id number]  with undesired fertility,status post vaginal delivery, expressed desire for permanent sterilization.  I reviewed with the patient her expressed plan for tubal sterilization.  We dicussed other reversible forms of contraception including LARC options with similar effectiveness of BTS. She expressed that she strongly desire permanent sterilization. She declines all other modalities. Risks of procedure discussed with patient including but not limited to: risk of regret, permanence of method, bleeding, infection, injury to surrounding organs and need for additional procedures.  Failure risk of 0.5-1% with increased risk of ectopic gestation if pregnancy occurs was also discussed with patient.    Federico Flake, MD

## 2023-05-09 NOTE — Anesthesia Preprocedure Evaluation (Addendum)
Anesthesia Evaluation  Patient identified by MRN, date of birth, ID band Patient awake    Reviewed: Allergy & Precautions, NPO status , Patient's Chart, lab work & pertinent test results  Airway Mallampati: II  TM Distance: >3 FB Neck ROM: Full    Dental  (+) Dental Advisory Given, Chipped,    Pulmonary neg pulmonary ROS   Pulmonary exam normal breath sounds clear to auscultation       Cardiovascular negative cardio ROS Normal cardiovascular exam Rhythm:Regular Rate:Normal     Neuro/Psych  Headaches  negative psych ROS   GI/Hepatic negative GI ROS, Neg liver ROS,,,  Endo/Other  negative endocrine ROS    Renal/GU negative Renal ROS  negative genitourinary   Musculoskeletal negative musculoskeletal ROS (+)    Abdominal   Peds negative pediatric ROS (+)  Hematology negative hematology ROS (+) Hb 13, plt 191   Anesthesia Other Findings   Reproductive/Obstetrics Desires sterility                              Anesthesia Physical Anesthesia Plan  ASA: 2  Anesthesia Plan: General   Post-op Pain Management: Tylenol PO (pre-op)*   Induction: Rapid sequence  PONV Risk Score and Plan: 2 and Ondansetron, Dexamethasone and Treatment may vary due to age or medical condition  Airway Management Planned: Oral ETT  Additional Equipment: None  Intra-op Plan:   Post-operative Plan: Extubation in OR  Informed Consent: I have reviewed the patients History and Physical, chart, labs and discussed the procedure including the risks, benefits and alternatives for the proposed anesthesia with the patient or authorized representative who has indicated his/her understanding and acceptance.     Dental advisory given  Plan Discussed with: CRNA  Anesthesia Plan Comments: (Pt preference for GA)       Anesthesia Quick Evaluation

## 2023-05-09 NOTE — Procedures (Deleted)
Hailey Ferguson 05/07/2023 - 05/09/2023  PREOPERATIVE DIAGNOSIS:  Undesired fertility  POSTOPERATIVE DIAGNOSIS:  Undesired fertility  PROCEDURE:  Postpartum Bilateral Tubal Sterilization using Ligasure   SURGEON: Federico Flake, MD  ASSISTANT: Patrcia Dolly  ANESTHESIA: spinal  COMPLICATIONS:  None immediate.  ESTIMATED BLOOD LOSS:  Less than 20cc.  FLUIDS: 1200 mL LR. URINE OUTPUT   INDICATIONS: 37 y.o. yo G2P2002  with undesired fertility,status post vaginal delivery, desires permanent sterilization. Risks and benefits of procedure discussed with patient including permanence of method, bleeding, infection, injury to surrounding organs and need for additional procedures. Risk failure of 0.5-1% with increased risk of ectopic gestation if pregnancy occurs was also discussed with patient.   FINDINGS:  Normal uterus, tubes, and ovaries. Right fallopian tube was significantly larger than the left. Specimens were sent separately  TECHNIQUE:  The patient was taken to the operating room where her epidural anesthesia was dosed up to surgical level and found to be adequate.  She was then placed in the dorsal supine position and prepped and draped in sterile fashion.  After an adequate timeout was performed, attention was turned to the patient's abdomen where a small transverse skin incision was made under the umbilical fold. The incision was taken down to the layer of fascia using the scalpel, and fascia was incised, and extended bilaterally using Mayo scissors. The peritoneum was entered in a sharp fashion.   Attention was then turned to the patient's uterus, and left fallopian tube was identified and followed out to the fimbriated end.  There was some scar tissue on the left that led to an avulsion of the fallopian tube. The Ligasure device was used to cauterize and cut the mesosalpinx to proximal end of the fallopian tube, removing ~3cm of tube. A similar process was  carried out on the right side but removing ~6cm of the right rube allowing for bilateral tubal sterilization.    Good hemostasis was noted overall.  The instruments were then removed from the patient's abdomen and the fascial incision was repaired with 0 Vicryl, and the skin was closed with a 3-0 Monocryl subcuticular stitch. The patient tolerated the procedure well.  Sponge, lap, and needle counts were correct times two.  The patient was then taken to the recovery room awake, extubated and in stable condition.   Federico Flake, MD 05/09/2023 3:00 PM

## 2023-05-09 NOTE — Anesthesia Procedure Notes (Signed)
Procedure Name: Intubation Date/Time: 05/09/2023 1:53 PM  Performed by: Renford Dills, CRNAPre-anesthesia Checklist: Patient identified, Patient being monitored, Timeout performed, Emergency Drugs available and Suction available Patient Re-evaluated:Patient Re-evaluated prior to induction Oxygen Delivery Method: Circle System Utilized Preoxygenation: Pre-oxygenation with 100% oxygen Induction Type: IV induction and Rapid sequence Laryngoscope Size: Miller and 2 Grade View: Grade II Tube type: Oral Tube size: 7.0 mm Number of attempts: 1 Airway Equipment and Method: stylet Placement Confirmation: ETT inserted through vocal cords under direct vision, positive ETCO2 and breath sounds checked- equal and bilateral Secured at: 21 cm Tube secured with: Tape Dental Injury: Teeth and Oropharynx as per pre-operative assessment

## 2023-05-09 NOTE — Lactation Note (Addendum)
This note was copied from a baby's chart. Lactation Consultation Note  Patient Name: Hailey Ferguson [id number] Date: 05/09/2023 Age:38 hours Reason for consult: Follow-up assessment;Term  P2, 7.8% weight loss. 6 voids/5 stools in the last 24 hours. Mother going for BTL today. Wanted to assist with feeding prior to mother's surgery.  Baby was unwrapped for feeding.  Assisted with latching, flanging bottom lip.  After a few minutes, baby came off.  Guided baby deep on breast again.  Encouraged feeding on demand and offer both breast per feeding.   Maternal Data Has patient been taught Hand Expression?: Yes Does the patient have breastfeeding experience prior to this delivery?: Yes  Feeding Mother's Current Feeding Choice: Breast Milk  LATCH Score Latch: Grasps breast easily, tongue down, lips flanged, rhythmical sucking.  Audible Swallowing: A few with stimulation  Type of Nipple: Everted at rest and after stimulation  Comfort (Breast/Nipple): Soft / non-tender  Hold (Positioning): Assistance needed to correctly position infant at breast and maintain latch.  LATCH Score: 8  Interventions Interventions: Assisted with latch;Education Consult Status Consult Status: Follow-up Date: 05/10/23 Follow-up type: In-patient    Dahlia Byes [facility]  RN IBCLC 05/09/2023, 10:26 AM

## 2023-05-09 NOTE — Transfer of Care (Signed)
Immediate Anesthesia Transfer of Care Note  Patient: Hailey Ferguson  Procedure(s) Performed: POST PARTUM TUBAL LIGATION (Bilateral)  Patient Location: PACU  Anesthesia Type:General  Level of Consciousness: drowsy  Airway & Oxygen Therapy: Patient Spontanous Breathing and Patient connected to nasal cannula oxygen  Post-op Assessment: Report given to RN and Post -op Vital signs reviewed and stable  Post vital signs: Reviewed and stable  Last Vitals:  Vitals Value Taken Time  BP 115/69 05/09/23 1510  Temp    Pulse 84 05/09/23 1515  Resp 16 05/09/23 1515  SpO2 100 % 05/09/23 1515  Vitals shown include unfiled device data.  Last Pain:  Vitals:   05/09/23 1340  TempSrc: (P) Oral  PainSc:          Complications: No notable events documented.

## 2023-05-09 NOTE — Progress Notes (Signed)
POSTPARTUM PROGRESS NOTE  Subjective: Kyliee Wanette Robison is a 37 y.o. [id number] s/p SVD at [redacted]w[redacted]d.  She reports she is doing well. No acute events overnight. She denies any problems with ambulating, voiding or po intake. Denies nausea or vomiting. She has passed flatus. Pain is moderately controlled.  Lochia is moderate.  Objective: Blood pressure (!) 83/62, pulse 65, temperature 97.9 F (36.6 C), temperature source Oral, resp. rate 16, height 5' (1.524 m), weight 68 kg, last menstrual period 07/26/2022, SpO2 99%, unknown if currently breastfeeding.  Physical Exam:  General: alert, cooperative and no distress Chest: no respiratory distress Abdomen: soft, non-tender  Uterine Fundus: firm and at level of umbilicus GU: No polyp noted at the vaginal introitus. Extremities: No calf swelling or tenderness  no edema  Recent Labs    05/07/23 0710  HGB 13.0  HCT 39.2    Assessment/Plan: Eugina Maya Vides is a 37 y.o. [id number] s/p SVD at [redacted]w[redacted]d for IOL 2/2 postdates.  Routine Postpartum Care: Doing well, pain well-controlled.  -- Continue routine care, lactation support  -- Contraception: BTL scheduled for 7/25 -- Feeding: breast -- Patient requests confirmation that polyp is no longer present when back in OR for BTL.   Dispo: Plan for discharge today following BTL.  Glee Arvin, MD Faculty Practice, [facility] 05/09/2023 7:46 AM

## 2023-05-10 NOTE — Social Work (Signed)
CSW received consult for Edinburgh Postnatal Depression Screening of 11.  CSW met with MOB to offer support and complete assessment. CSW entered the room and noticed MOB resting in bed, holding the infant and several room guest. CSW introduced self, CSW role and reason for visit, MOB was agreeable and allowed her guests to remain in th room. CSW inquired about how MON was feeling, MOB reported good. CSW inquired about MOB's mood over the past 7 days, MOB reported feeling worried since she was past her due date. CSW inquired about any MH hx, MOB reported no MH concerns. CSW provided education regarding the baby blues period vs. perinatal mood disorders, discussed treatment and gave resources for mental health follow up if concerns arise.  CSW recommends self-evaluation during the postpartum time period using the New Mom Checklist from Postpartum Progress and encouraged MOB to contact a medical professional if symptoms are noted at any time. MOB identified her husband as her support.     CSW provided review of Sudden Infant Death Syndrome (SIDS) precautions.  MOB identified [facility] for infants follow up. MOB reported they have all necessary items for the infant including a bassinet, crib and car seat.  CSW identifies no further need for intervention and no barriers to discharge at this time.  Wende Neighbors, LCSWA Clinical Social Worker [phone]

## 2023-05-10 NOTE — Lactation Note (Signed)
This note was copied from a baby's chart. Lactation Consultation Note  Patient Name: Hailey Ferguson [id number] Date: 05/10/2023 Age:38 hours  LC was going to see mom but RN had put resting sign on door. RN stated they are tired and wanted to rest.   Maternal Data    Feeding    LATCH Score                    Lactation Tools Discussed/Used    Interventions    Discharge    Consult Status      [name] 05/10/2023, 4:59 AM

## 2023-05-10 NOTE — Lactation Note (Signed)
This note was copied from a baby's chart. Lactation Consultation Note  Patient Name: Hailey Ferguson [id number] Date: 05/10/2023 Age:38 hours Reason for consult: Follow-up assessment;Infant weight loss;Breastfeeding assistance;Term (6.33% WL)  The infant was at 69 hours old.  Per the birth parent the infant has been feeding well at the breast.  She stated that she did not have any concerns.  LC reviewed outpatient services, engorgement, mastitis, breast care, and infant I/O.  The birth parent requested a manual pump due to not having a pump at home.  LC gave the birth parent a manual pump and reviewed pump assembly, disassembly, washing pump parts, and milk storage.  All questions were answered.   Infant Feeding Plan:  Breastfeed 8+ times in 24 hours according to feeding cues.  Watch infant output and call the pediatrician with concerns.  Call the outpatient [facility] for assistance with breastfeeding.  Prioritize maternal rest, hydration, and nutrition.   Feeding Mother's Current Feeding Choice: Breast Milk  LATCH Score Latch: Grasps breast easily, tongue down, lips flanged, rhythmical sucking.  Audible Swallowing: A few with stimulation  Type of Nipple: Everted at rest and after stimulation  Comfort (Breast/Nipple): Soft / non-tender  Hold (Positioning): No assistance needed to correctly position infant at breast.  LATCH Score: 9   Lactation Tools Discussed/Used Tools: Pump Breast pump type: Manual Pump Education: Setup, frequency, and cleaning;Milk Storage Reason for Pumping: Parent request; no pump at home Pumping frequency: PRN  Discharge Discharge Education: Engorgement and breast care;Warning signs for feeding baby;Outpatient recommendation  Consult Status Consult Status: Complete Date: 05/10/23 Follow-up type: Call as needed   Delene Loll 05/10/2023, 1:58 PM

## 2023-05-13 ENCOUNTER — Telehealth (HOSPITAL_COMMUNITY): Payer: Self-pay | Admitting: *Deleted

## 2023-05-13 DIAGNOSIS — Z1331 Encounter for screening for depression: Secondary | ICD-10-CM

## 2023-05-13 NOTE — Telephone Encounter (Signed)
Patient scored 11 on EPDS in hospital with answer to question ten being 0.  Placed IBH referral order.  Dr. Crissie Reese notified via [facility] order.  Salena Saner, RN 05/13/2023 09:30

## 2023-05-14 ENCOUNTER — Other Ambulatory Visit: Payer: Self-pay

## 2023-05-14 NOTE — BH Specialist Note (Signed)
Integrated Behavioral Health via Telemedicine Visit  05/27/2023 Keerat Gaier [id number]  Number of Integrated Behavioral Health Clinician visits: 1- Initial Visit  Session Start time: 1347   Session End time: 1359  Total time in minutes: 12   Referring Provider: Merian Capron, MD Patient/Family location: Home [facility] Provider [facility]: [facility]  All persons participating in visit: Patient Hailey Ferguson and [facility] Madelyne Millikan   Types of Service: Individual psychotherapy and Video visit  I connected with Hailey Ferguson and/or Hailey Ferguson  n/a  via  Telephone or Video Enabled Telemedicine Application  (Video is Caregility application) and verified that I am speaking with the correct person using two identifiers. Discussed confidentiality: Yes   I discussed the limitations of telemedicine and the availability of in person appointments.  Discussed there is a possibility of technology failure and discussed alternative modes of communication if that failure occurs.  I discussed that engaging in this telemedicine visit, they consent to the provision of behavioral healthcare and the services will be billed under their insurance.  Patient and/or legal guardian expressed understanding and consented to Telemedicine visit: Yes   Presenting Concerns: Patient and/or family reports the following symptoms/concerns: No concerns at this time; eating and sleeping well, good support at home, anxiety resolved after birth (was anxious about going over due date only).   Patient and/or Family's Strengths/Protective Factors: Social connections, Social and Patent attorney, Concrete supports in place (healthy food, safe environments, etc.), Sense of purpose, and Physical Health (exercise, healthy diet, medication compliance, etc.)  Goals Addressed: Patient will:    Demonstrate ability to:  Maintain healthy adjustment to new  motherhood  Progress towards Goals: Achieved  Interventions: Interventions utilized:  Psychoeducation and/or Health Education, Link to [employer], and Supportive Reflection Standardized Assessments completed: GAD-7 and PHQ 9  Patient and/or Family Response: Patient Hailey Ferguson and [facility] Rebecka Oelkers    Assessment: Patient currently experiencing At risk for depression postpartum (mild).   Patient may benefit from psychoeducation and brief therapeutic interventions regarding maintaining adjustment to new motherhood .  Plan: Follow up with behavioral health clinician on : Call Neri Samek at [phone], as needed. Behavioral recommendations:  --Continue prioritizing healthy self-care (regular meals, adequate rest; allowing practical help from supportive friends and family) until at least postpartum medical appointment -Consider new mom support group as needed at either [email or web address] or [email or web address]  Referral(s): Integrated Art gallery manager (In Clinic) and [employer]:  new mom support  I discussed the assessment and treatment plan with the patient and/or parent/guardian. They were provided an opportunity to ask questions and all were answered. They agreed with the plan and demonstrated an understanding of the instructions.   They were advised to call back or seek an in-person evaluation if the symptoms worsen or if the condition fails to improve as anticipated.  Valetta Close Jakelin Taussig, LCSW     05/27/2023    1:53 PM 02/14/2023    8:41 AM 10/24/2022    8:38 AM 09/26/2022    8:50 AM 11/20/2019   10:41 AM  Depression screen PHQ 2/9  Decreased Interest 0 1 0 0 0  Down, Depressed, Hopeless 0 0 0 1 0  PHQ - 2 Score 0 1 0 1 0  Altered sleeping 0 1 0 1   Tired, decreased energy 0 2 0 1   Change in appetite 0 0 0 1   Feeling bad or failure about yourself  0 0 0  0   Trouble concentrating 0 1 0 0   Moving slowly or fidgety/restless 0 0 0 0   Suicidal  thoughts 0 0 0 0   PHQ-9 Score 0 5 0 4       05/27/2023    1:55 PM 02/14/2023    8:42 AM 10/24/2022    8:37 AM 09/26/2022    8:52 AM  GAD 7 : Generalized Anxiety Score  Nervous, Anxious, on Edge 0 0 0 0  Control/stop worrying 0 0 0 0  Worry too much - different things 0 0 0 0  Trouble relaxing 0 1 0 0  Restless 1 1 0 0  Easily annoyed or irritable 0 0 0 0  Afraid - awful might happen 0 1 0 0  Total GAD 7 Score 1 3 0 0

## 2023-05-14 NOTE — Op Note (Signed)
Pre-procedure Diagnoses  Unwanted fertility [Z30.09]   Post-procedure Diagnoses  Unwanted fertility [Z30.09]   Procedures  TUBAL LIGATION [JYN82 (Custom)]        Hailey Ferguson 05/07/2023 - 05/09/2023   PREOPERATIVE DIAGNOSIS:  Undesired fertility   POSTOPERATIVE DIAGNOSIS:  Undesired fertility   PROCEDURE:  Postpartum Bilateral Tubal Sterilization using Ligasure    SURGEON: Federico Flake, MD   ASSISTANT: Patrcia Dolly   ANESTHESIA: spinal   COMPLICATIONS:  None immediate.   ESTIMATED BLOOD LOSS:  Less than 20cc.   FLUIDS: 1200 mL LR. URINE OUTPUT    INDICATIONS: 37 y.o. yo G2P2002  with undesired fertility,status post vaginal delivery, desires permanent sterilization. Risks and benefits of procedure discussed with patient including permanence of method, bleeding, infection, injury to surrounding organs and need for additional procedures. Risk failure of 0.5-1% with increased risk of ectopic gestation if pregnancy occurs was also discussed with patient.   FINDINGS:  Normal uterus, tubes, and ovaries. Right fallopian tube was significantly larger than the left. Specimens were sent separately   TECHNIQUE:  The patient was taken to the operating room where her epidural anesthesia was dosed up to surgical level and found to be adequate.  She was then placed in the dorsal supine position and prepped and draped in sterile fashion.  After an adequate timeout was performed, attention was turned to the patient's abdomen where a small transverse skin incision was made under the umbilical fold. The incision was taken down to the layer of fascia using the scalpel, and fascia was incised, and extended bilaterally using Mayo scissors. The peritoneum was entered in a sharp fashion.    Attention was then turned to the patient's uterus, and left fallopian tube was identified and followed out to the fimbriated end.  There was some scar tissue on the left that led to an  avulsion of the fallopian tube. The Ligasure device was used to cauterize and cut the mesosalpinx to proximal end of the fallopian tube, removing ~3cm of tube. A similar process was carried out on the right side but removing ~6cm of the right rube allowing for bilateral tubal sterilization.     Good hemostasis was noted overall.  The instruments were then removed from the patient's abdomen and the fascial incision was repaired with 0 Vicryl, and the skin was closed with a 3-0 Monocryl subcuticular stitch. The patient tolerated the procedure well.  Sponge, lap, and needle counts were correct times two.  The patient was then taken to the recovery room awake, extubated and in stable condition.     Federico Flake, MD 05/09/2023 3:00 PM

## 2023-05-27 ENCOUNTER — Ambulatory Visit: Payer: Medicaid Other | Admitting: Clinical

## 2023-05-27 DIAGNOSIS — Z9189 Other specified personal risk factors, not elsewhere classified: Secondary | ICD-10-CM

## 2023-05-27 NOTE — Patient Instructions (Signed)
[facility] [address] [place], [place] [address] [phone] (main office) [phone] (Jamie's office)  New Parent Support Groups [email or web address]   

## 2023-06-03 ENCOUNTER — Telehealth (HOSPITAL_COMMUNITY): Payer: Self-pay

## 2023-06-03 NOTE — Telephone Encounter (Signed)
06/03/2023 1454  Name: Hailey Ferguson MRN: [id number] DOB: [birth year]  Reason for Call:  Transition of Care Hospital Discharge Call  Contact Status: Patient Contact Status: Complete  Language assistant needed: Interpreter Mode: Interpreter Not Needed        Follow-Up Questions: Do You Have Any Concerns About Your Health As You Heal From Delivery?: No Do You Have Any Concerns About Your Infants Health?: Yes What Concerns Do You Have About Your Baby?: Patient states that baby has a rash on his rash on his face. RN told patient to contact her pediatrician about skin rash. Patient has no other questions or concerns about baby.  Edinburgh Postnatal Depression Scale:  In the Past 7 Days:    PHQ2-9 Depression Scale:     Discharge Follow-up: Edinburgh score requires follow up?:  (Patient declines EPDS today. Patient states that she is doing well emotionally.) Patient was advised of the following resources:: Support Group, Breastfeeding Support Group Did patient express any COVID concerns?: No  Post-discharge interventions: Reviewed Newborn Safe Sleep Practices  Signature  Signe Colt

## 2023-06-26 ENCOUNTER — Encounter: Payer: Self-pay | Admitting: Obstetrics and Gynecology

## 2023-06-26 ENCOUNTER — Ambulatory Visit (INDEPENDENT_AMBULATORY_CARE_PROVIDER_SITE_OTHER): Payer: Medicaid Other | Admitting: Obstetrics and Gynecology

## 2023-06-26 NOTE — Progress Notes (Signed)
PP, Pt wants her cervical polyp removed

## 2023-06-26 NOTE — Progress Notes (Signed)
Post Partum Visit Note  Hailey Ferguson is a 37 y.o. G7P2002 female who presents for a postpartum visit. She is 7 weeks postpartum following a normal spontaneous vaginal delivery.  I have fully reviewed the prenatal and intrapartum course. The delivery was at 40.5 gestational weeks.  Anesthesia: epidural. Postpartum course has been unremarkable. Baby is doing well. Baby is feeding by breast. Bleeding no bleeding. Bowel function is normal. Bladder function is normal. Patient is not sexually active. Contraception method is tubal ligation. Postpartum depression screening: negative.   Upstream - 06/26/23 1123       Pregnancy Intention Screening   Does the patient want to become pregnant in the next year? No    Does the patient's partner want to become pregnant in the next year? No    Would the patient like to discuss contraceptive options today? No      Contraception Wrap Up   Current Method Female Sterilization    End Method Female Sterilization            The pregnancy intention screening data noted above was reviewed. Potential methods of contraception were discussed. The patient elected to proceed with Female Sterilization.   Edinburgh Postnatal Depression Scale - 06/26/23 1122       Edinburgh Postnatal Depression Scale:  In the Past 7 Days   I have been able to laugh and see the funny side of things. 0    I have looked forward with enjoyment to things. 0    I have blamed myself unnecessarily when things went wrong. 0    I have been anxious or worried for no good reason. 0    I have felt scared or panicky for no good reason. 0    Things have been getting on top of me. 0    I have been so unhappy that I have had difficulty sleeping. 0    I have felt sad or miserable. 0    I have been so unhappy that I have been crying. 0    The thought of harming myself has occurred to me. 0    Edinburgh Postnatal Depression Scale Total 0             Health Maintenance Due  Topic  Date Due   INFLUENZA VACCINE  05/16/2023   COVID-19 Vaccine (3 - 2023-24 season) 06/16/2023    The following portions of the patient's history were reviewed and updated as appropriate: allergies, current medications, past family history, past medical history, past social history, past surgical history, and problem list.  Review of Systems Pertinent items are noted in HPI.  Objective:  BP 96/65   Pulse 83   Ht 5' (1.524 m)   Wt 146 lb (66.2 kg)   LMP 07/26/2022 (Exact Date)   Breastfeeding Yes   BMI 28.51 kg/m    General:  alert, cooperative, and no distress   Breasts:  not indicated  Lungs: clear to auscultation bilaterally  Heart:  regular rate and rhythm  Abdomen: soft, non-tender; bowel sounds normal; no masses,  no organomegaly   Wound N/a  GU exam:   Cervix normal, no polyp noted       Assessment:    1. Postpartum care and examination [Z39.2] Normal postpartum visit    postpartum exam.   Plan:   Essential components of care per ACOG recommendations:  1.  Mood and well being: Patient with negative depression screening today. Reviewed local resources for support.  - Patient  tobacco use? No.   - hx of drug use? No.    2. Infant care and feeding:  -Patient currently breastmilk feeding? Yes. Reviewed importance of draining breast regularly to support lactation.  -Social determinants of health (SDOH) reviewed in EPIC. No concerns  3. Sexuality, contraception and birth spacing - Patient does not want a pregnancy in the next year.  Desired family size is 2 children.  - Reviewed reproductive life planning. Reviewed contraceptive methods based on pt preferences and effectiveness.  Patient desired Female Sterilization today.   - Discussed birth spacing of 18 months  4. Sleep and fatigue -Encouraged family/partner/community support of 4 hrs of uninterrupted sleep to help with mood and fatigue  5. Physical Recovery  - Discussed patients delivery and complications. She  describes her labor as good. - Patient had a Vaginal, no problems at delivery. Patient had a 2nd degree laceration. Perineal healing reviewed. Patient expressed understanding - Patient has urinary incontinence? No. - Patient is safe to resume physical and sexual activity  6.  Health Maintenance - HM due items addressed Yes - Last pap smear  Diagnosis  Date Value Ref Range Status  10/24/2022   Final   - Negative for intraepithelial lesion or malignancy (NILM)   Pap smear not done at today's visit.  -Breast Cancer screening indicated? No.   7. Chronic Disease/Pregnancy Condition follow up: None  - PCP follow up Pt complains of small hemorrhoid.  Hemorrhoid noted at 12 oclock. Pt advised to try preparation H or proctofoam.  If it remains an issue then will refer to general surgery   F/u in 1 year for annual exam [facility] [employer], [facility]

## 2023-09-16 ENCOUNTER — Ambulatory Visit: Payer: Medicaid Other

## 2023-09-16 DIAGNOSIS — R0981 Nasal congestion: Secondary | ICD-10-CM

## 2023-09-16 NOTE — Progress Notes (Signed)
Pt reports nasal congestion,cough and headache that started about two days ago. Pt reports that headache is relieved with OTC medications at this time.

## 2023-09-17 ENCOUNTER — Encounter: Payer: Self-pay | Admitting: Registered Nurse

## 2023-09-17 ENCOUNTER — Ambulatory Visit: Payer: Medicaid Other | Admitting: Registered Nurse

## 2023-09-17 VITALS — BP 118/79 | HR 93 | Temp 98.0°F | Resp 18

## 2023-09-17 DIAGNOSIS — J019 Acute sinusitis, unspecified: Secondary | ICD-10-CM

## 2023-09-17 DIAGNOSIS — H6501 Acute serous otitis media, right ear: Secondary | ICD-10-CM

## 2023-09-17 MED ORDER — SALINE SPRAY 0.65 % NA SOLN: 2.0000 | NASAL | Status: AC

## 2023-09-17 MED ORDER — AMOXICILLIN-POT CLAVULANATE 875-125 MG PO TABS: 1.0000 | ORAL_TABLET | Freq: Two times a day (BID) | ORAL | Status: AC

## 2023-09-17 MED ORDER — ACETAMINOPHEN 500 MG PO TABS
1000.0000 mg | ORAL_TABLET | Freq: Four times a day (QID) | ORAL | Status: AC | PRN
Start: 2023-09-17 — End: 2023-09-20

## 2023-09-17 NOTE — Progress Notes (Signed)
Subjective:    Patient ID: Hailey Ferguson, female    DOB: [birth year], 37 y.o.   MRN: [id number]  37y/o established female here for evaluation congestion, headache, body aches, fatigue, and right ear pain worsening x 4 days.  Denied fever/chills/n/v/d/known sick contacts close.  Still breastfeeding infant at home wondering what medications she can take.  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      Review of Systems  Constitutional:  Positive for fatigue. Negative for chills, diaphoresis and fever.  HENT:  Positive for congestion, ear pain, postnasal drip, rhinorrhea, sinus pressure, sinus pain and sore throat. Negative for ear discharge, facial swelling, hearing loss, mouth sores, nosebleeds, sneezing, tinnitus, trouble swallowing and voice change.   Eyes:  Negative for photophobia and visual disturbance.  Respiratory:  Positive for cough. Negative for choking, chest tightness, shortness of breath, wheezing and stridor.   Cardiovascular:  Negative for chest pain and palpitations.  Gastrointestinal:  Negative for diarrhea, nausea and vomiting.  Genitourinary:  Negative for difficulty urinating.  Musculoskeletal:  Positive for myalgias. Negative for back pain, gait problem, joint swelling, neck pain and neck stiffness.  Skin:  Negative for rash.  Neurological:  Positive for headaches. Negative for dizziness, tremors, seizures, syncope, facial asymmetry, speech difficulty, weakness, light-headedness and numbness.  Hematological:  Negative for adenopathy. Does not bruise/bleed easily.  Psychiatric/Behavioral:  Positive for sleep disturbance. Negative for agitation and confusion.        Objective:   Physical Exam Vitals reviewed.  Constitutional:      General: She is awake. She is not in acute distress.    Appearance: Normal appearance. She is well-developed and  well-groomed. She is ill-appearing. She is not toxic-appearing or diaphoretic.  HENT:     Head: Normocephalic and atraumatic.     Jaw: There is normal jaw occlusion. No trismus, tenderness, swelling, pain on movement or malocclusion.     Salivary Glands: Right salivary gland is not diffusely enlarged or tender. Left salivary gland is not diffusely enlarged or tender.     Right Ear: Hearing, ear canal and external ear normal. No decreased hearing noted. No laceration, drainage, swelling or tenderness. A middle ear effusion is present. There is no impacted cerumen. No foreign body. No mastoid tenderness. No PE tube. No hemotympanum. Tympanic membrane is erythematous and bulging. Tympanic membrane is not injected, scarred, perforated or retracted.     Left Ear: Hearing, ear canal and external ear normal. No decreased hearing noted. No laceration, drainage, swelling or tenderness. A middle ear effusion is present. There is no impacted cerumen. No foreign body. No mastoid tenderness. No PE tube. No hemotympanum. Tympanic membrane is not injected, scarred, perforated, erythematous, retracted or bulging.     Ears:     Comments: Left TM air fluid level clear; no debris noted bilateral auditory canals; right tm bulging air fluid level slight opacity and TM with erythema 11-3 oclock    Nose: Mucosal edema, congestion and rhinorrhea present. No nasal deformity, septal deviation or laceration. Rhinorrhea is clear.     Right Nostril: No epistaxis.     Left Nostril: No epistaxis.     Right Turbinates: Enlarged and swollen. Not pale.     Left Turbinates: Enlarged and swollen. Not pale.     Right Sinus: Maxillary sinus tenderness and frontal sinus tenderness present.     Left Sinus: Maxillary sinus tenderness and  frontal sinus tenderness present.     Comments: Nasal sniffing and audible congestion in clinic; nonproductive throat clearing cough in clinic observed; nasal turbinates edema clear discharge from above;  bilateral allergic shiners    Mouth/Throat:     Lips: Pink. No lesions.     Mouth: Mucous membranes are moist. Mucous membranes are not pale, not dry and not cyanotic. No lacerations, oral lesions or angioedema.     Dentition: No dental abscesses or gum lesions.     Pharynx: Uvula midline. Pharyngeal swelling, posterior oropharyngeal erythema and postnasal drip present. No oropharyngeal exudate or uvula swelling.     Tonsils: No tonsillar exudate or tonsillar abscesses. 1+ on the right. 1+ on the left.  Eyes:     General: Lids are normal. Vision grossly intact. Gaze aligned appropriately. Allergic shiner present. No scleral icterus.       Right eye: No foreign body, discharge or hordeolum.        Left eye: No foreign body, discharge or hordeolum.     Extraocular Movements:     Right eye: Normal extraocular motion and no nystagmus.     Left eye: Normal extraocular motion and no nystagmus.     Conjunctiva/sclera: Conjunctivae normal.     Right eye: Right conjunctiva is not injected. No chemosis, exudate or hemorrhage.    Left eye: Left conjunctiva is not injected. No chemosis, exudate or hemorrhage.    Pupils: Pupils are equal, round, and reactive to light. Pupils are equal.     Right eye: Pupil is round and reactive.     Left eye: Pupil is round and reactive.  Neck:     Thyroid: No thyroid mass or thyromegaly.     Trachea: Trachea and phonation normal. No tracheal tenderness or tracheal deviation.  Cardiovascular:     Rate and Rhythm: Normal rate and regular rhythm.     Pulses: Normal pulses.          Radial pulses are 2+ on the right side and 2+ on the left side.     Heart sounds: Normal heart sounds, S1 normal and S2 normal.  Pulmonary:     Effort: Pulmonary effort is normal. No accessory muscle usage or respiratory distress.     Breath sounds: Normal breath sounds and air entry. No stridor, decreased air movement or transmitted upper airway sounds. No decreased breath sounds,  wheezing, rhonchi or rales.     Comments: Spoke full sentences without difficulty; intermittent dry cough Chest:     Chest wall: No tenderness.  Abdominal:     General: Abdomen is flat. There is no distension.     Palpations: Abdomen is soft.  Musculoskeletal:        General: No tenderness. Normal range of motion.     Right hand: Normal strength. Normal capillary refill.     Left hand: Normal strength. Normal capillary refill.     Cervical back: Normal range of motion and neck supple. No swelling, edema, deformity, erythema, signs of trauma, lacerations, rigidity, spasms, torticollis, tenderness or crepitus. No pain with movement or muscular tenderness. Normal range of motion.     Thoracic back: No swelling, edema, deformity, signs of trauma, lacerations, spasms or tenderness. Normal range of motion.     Right lower leg: No edema.     Left lower leg: No edema.  Lymphadenopathy:     Head:     Right side of head: No submental, submandibular, tonsillar, preauricular, posterior auricular or occipital adenopathy.  Left side of head: No submental, submandibular, tonsillar, preauricular, posterior auricular or occipital adenopathy.     Cervical: No cervical adenopathy.     Right cervical: No superficial, deep or posterior cervical adenopathy.    Left cervical: No superficial, deep or posterior cervical adenopathy.  Skin:    General: Skin is warm and dry.     Capillary Refill: Capillary refill takes less than 2 seconds.     Coloration: Skin is not ashen, cyanotic, jaundiced, mottled, pale or sallow.     Findings: No abrasion, abscess, acne, bruising, burn, ecchymosis, erythema, signs of injury, laceration, lesion, petechiae, rash or wound.     Nails: There is no clubbing.  Neurological:     General: No focal deficit present.     Mental Status: She is alert and oriented to person, place, and time. Mental status is at baseline. She is not disoriented.     GCS: GCS eye subscore is 4. GCS  verbal subscore is 5. GCS motor subscore is 6.     Cranial Nerves: Cranial nerves 2-12 are intact. No cranial nerve deficit, dysarthria or facial asymmetry.     Sensory: No sensory deficit.     Motor: Motor function is intact. No weakness, tremor, atrophy, abnormal muscle tone or seizure activity.     Coordination: Coordination is intact. Coordination normal.     Gait: Gait is intact. Gait normal.     Comments: In/out of chair without difficulty gait sure and steady in clinic bilateral hand grasp equal 5/5  Psychiatric:        Attention and Perception: Attention and perception normal.        Mood and Affect: Mood and affect normal.        Speech: Speech normal.        Behavior: Behavior normal. Behavior is cooperative.        Thought Content: Thought content normal.        Cognition and Memory: Cognition and memory normal.        Judgment: Judgment normal.           Assessment & Plan:   A-right acute otitis media, acute rhinosinusitis  P-Supportive treatment. Augmentin 875mg  po BID x 10 days #20 RF0 dispensed from [facility] to patient  Tylenol 1000mg  po QID prn pain/fever given 4 UD from clinic stock.   No evidence of invasive bacterial infection, non toxic and well hydrated.  I do not see where any further testing or imaging is necessary at this time.   I will suggest supportive care, rest, good hygiene and encourage the patient to take adequate fluids.  The patient is to return to clinic or EMERGENCY ROOM if symptoms worsen or change significantly e.g. ear pain, fever, purulent discharge from ears or bleeding.  Exitcare handout on otitis media   Patient verbalized agreement and understanding of treatment plan.     Discussed viral illnesses common in community. Discussed dry up drip and cough will improve.  May use honey 1 tablespoon every 4 hours prn cough. Antibiotics do not help viral illnesses.  Flonase works against allergies/bacterial and viral sinusitis.  start flonase 1 spray each  nostril BID OTC, given 1 bottle from clinic stock discussed administration sniff until feels water in back of throat wait 5 minutes then blow nose may repeat 2-3 times if thick mucous/congestion saline 2 sprays each nostril q2h wa prn congestion.  If no improvement with 48 hours of saline and flonase use start augmentin 875mg  po BID x  10 days #20 RF0 dispensed from [facility] to patient  Discussed safe in breastfeeding but may cause diarrhea in infant.  Denied personal or family history of ENT cancer.  Shower BID especially prior to bed. No evidence of systemic bacterial infection, non toxic and well hydrated.  I do not see where any further testing or imaging is necessary at this time.   I will suggest supportive care, rest, good hygiene and encourage the patient to take adequate fluids.  The patient is to return to clinic or EMERGENCY ROOM if symptoms worsen or change significantly.  Exitcare handout on sinusitis and sinus rinse.  Patient verbalized agreement and understanding of treatment plan and had no further questions at this time.   P2:  Hand washing and cover cough

## 2023-09-17 NOTE — Patient Instructions (Signed)
Otitis Media, Adult  Otitis media occurs when there is inflammation and fluid in the middle ear with signs and symptoms of an acute infection. The middle ear is a part of the ear that contains bones for hearing as well as air that helps send sounds to the brain. When infected fluid builds up in this space, it causes pressure and can lead to an ear infection. The eustachian tube connects the middle ear to the back of the nose (nasopharynx) and normally allows air into the middle ear. If the eustachian tube becomes blocked, fluid can build up and become infected. What are the causes? This condition is caused by a blockage in the eustachian tube. This can be caused by mucus or by swelling of the tube. Problems that can cause a blockage include: A cold or other upper respiratory infection. Allergies. An irritant, such as tobacco smoke. Enlarged adenoids. The adenoids are areas of soft tissue located high in the back of the throat, behind the nose and the roof of the mouth. They are part of the body's defense system (immune system). A mass in the nasopharynx. Damage to the ear caused by pressure changes (barotrauma). What increases the risk? You are more likely to develop this condition if you: Smoke or are exposed to tobacco smoke. Have an opening in the roof of your mouth (cleft palate). Have gastroesophageal reflux. Have an immune system disorder. What are the signs or symptoms? Symptoms of this condition include: Ear pain. Fever. Decreased hearing. Tiredness (lethargy). Fluid leaking from the ear, if the eardrum is ruptured or has burst. Ringing in the ear. How is this diagnosed?  This condition is diagnosed with a physical exam. During the exam, your health care provider will use an instrument called an otoscope to look in your ear and check for redness, swelling, and fluid. He or she will also ask about your symptoms. Your health care provider may also order tests, such as: A pneumatic  otoscopy. This is a test to check the movement of the eardrum. It is done by squeezing a small amount of air into the ear. A tympanogram. This is a test that shows how well the eardrum moves in response to air pressure in the ear canal. It provides a graph for your health care provider to review. How is this treated? This condition can go away on its own within 3-5 days. But if the condition is caused by a bacterial infection and does not go away on its own, or if it keeps coming back, your health care provider may: Prescribe antibiotic medicine to treat the infection. Prescribe or recommend medicines to control pain. Follow these instructions at home: Take over-the-counter and prescription medicines only as told by your health care provider. If you were prescribed an antibiotic medicine, take it as told by your health care provider. Do not stop taking the antibiotic even if you start to feel better. Keep all follow-up visits. This is important. Contact a health care provider if: You have bleeding from your nose. There is a lump on your neck. You are not feeling better in 5 days. You feel worse instead of better. Get help right away if: You have severe pain that is not controlled with medicine. You have swelling, redness, or pain around your ear. You have stiffness in your neck. A part of your face is not moving (paralyzed). The bone behind your ear (mastoid bone) is tender when you touch it. You develop a severe headache. Summary Otitis media  is redness, soreness, and swelling of the middle ear, usually resulting in pain and decreased hearing. This condition can go away on its own within 3-5 days. If the problem does not go away in 3-5 days, your health care provider may give you medicines to treat the infection. If you were prescribed an antibiotic medicine, take it as told by your health care provider. Follow all instructions that were given to you by your health care provider. This  information is not intended to replace advice given to you by your health care provider. Make sure you discuss any questions you have with your health care provider. Document Revised: 01/09/2021 Document Reviewed: 01/09/2021 Elsevier Patient Education  2024 [employer] How to Perform a Sinus Rinse A sinus rinse is a home treatment that is used to rinse your sinuses with a germ-free (sterile) mixture of salt and water (saline solution). Sinuses are air-filled spaces in your skull that are behind the bones of your face and forehead. They open into your nasal cavity. A sinus rinse can help to clear mucus, dirt, dust, or pollen from your nasal cavity. You may do a sinus rinse when you have a cold, a virus, nasal allergy symptoms, a sinus infection, or stuffiness in your nose or sinuses. What are the risks? A sinus rinse is generally safe and effective. However, there are a few risks, which include: A burning sensation in your sinuses. This may happen if you do not make the saline solution as directed. Be sure to follow all directions when making the saline solution. Nasal irritation. Infection. This may be from unclean supplies or from contaminated water. Infection from contaminated water is rare, but possible. Do not do a sinus rinse if you have had ear or nasal surgery, ear infection, or plugged ears, unless recommended by your health care provider. Supplies needed: Saline solution or powder. Distilled or sterile water to mix with saline powder. You may use boiled and cooled tap water. Boil tap water for 5 minutes; cool until it is lukewarm. Use within 24 hours. Do not use regular tap water to mix with the saline solution. Neti pot or nasal rinse bottle. These supplies release the saline solution into your nose and through your sinuses. Neti pots and nasal rinse bottles can be purchased at [employer], a health food store, or online. How to perform a sinus rinse  Wash your hands with  soap and water for at least 20 seconds. If soap and water are not available, use hand sanitizer. Wash your device according to the directions that came with the product and then dry it. Use the solution that comes with your product or one that is sold separately in stores. Follow the mixing directions on the package to mix with sterile or distilled water. Fill the device with the amount of saline solution noted in the device instructions. Stand by a sink and tilt your head sideways over the sink. Place the spout of the device in your upper nostril (the one closer to the ceiling). Gently pour or squeeze the saline solution into your nasal cavity. The liquid should drain out from the lower nostril if you are not too congested. While rinsing, breathe through your open mouth. Gently blow your nose to clear any mucus and rinse solution. Blowing too hard may cause ear pain. Turn your head in the other direction and repeat in your other nostril. Clean and rinse your device with clean water and then air-dry it. Talk with your health  care provider or pharmacist if you have questions about how to do a sinus rinse. Summary A sinus rinse is a home treatment that is used to rinse your sinuses with a sterile mixture of salt and water (saline solution). You may do a sinus rinse when you have a cold, a virus, nasal allergy symptoms, a sinus infection, or stuffiness in your nose or sinuses. A sinus rinse is generally safe and effective. Follow all instructions carefully. This information is not intended to replace advice given to you by your health care provider. Make sure you discuss any questions you have with your health care provider. Document Revised: 03/20/2021 Document Reviewed: 03/20/2021 Elsevier Patient Education  2024 [employer] Sinus Pain  Sinus pain may occur when your sinuses become clogged or swollen. Sinuses are air-filled spaces in your skull that are behind the bones of your face and  forehead. Sinus pain can range from mild to severe. What are the causes? Sinus pain can result from various conditions that affect the sinuses. Common causes include: Colds. Sinus infections. Allergies. What are the signs or symptoms? The main symptom of this condition is pain or pressure in your face, forehead, ears, or upper teeth. People who have sinus pain often have other symptoms, such as: Congested or runny nose. Fever. Inability to smell. Headache. Weather changes can make symptoms worse. How is this diagnosed? Your health care provider will diagnose this condition based on your symptoms and a physical exam. If you have pain that keeps coming back or does not go away, your health care provider may recommend more testing. This may include: Imaging tests, such as a CT scan or MRI, to check for problems with your sinuses. Examination of your sinuses using a thin tool with a camera that is inserted through your nose (endoscopy). How is this treated? Treatment for this condition depends on the cause. Sinus pain that is caused by a sinus infection may be treated with antibiotic medicine. Sinus pain that is caused by congestion may be helped by rinsing out (flushing) the nose and sinuses with saline solution. Sinus pain that is caused by allergies may be helped by allergy medicines (antihistamines) and medicated nasal sprays. Sinus surgery may be needed in some cases if other treatments do not help. Follow these instructions at home: General instructions If directed: Apply a warm, moist washcloth to your face to help relieve pain. Use a nasal saline wash. Follow the directions on the bottle or box. Hydrate and humidify Drink enough water to keep your urine clear or pale yellow. Staying hydrated will help to thin your mucus. Use a humidifier if your home is dry. Inhale steam for 10-15 minutes, 3-4 times a day or as told by your health care provider. You can do this in the bathroom  while a hot shower is running. Limit your exposure to cool or dry air. Medicines  Take over-the-counter and prescription medicines only as told by your health care provider. If you were prescribed an antibiotic medicine, take it as told by your health care provider. Do not stop taking the antibiotic even if you start to feel better. If you have congestion, use a nasal spray to help lessen pressure. Contact a health care provider if: You have sinus pain more than one time a week. You have sensitivity to light or sound. You develop a fever. You feel nauseous or you vomit. Your sinus pain or headache does not get better with treatment. Get help right away if: You have  vision problems. You have sudden, severe pain in your face or head. You have a seizure. You are confused. You have a stiff neck. Summary Sinus pain occurs when your sinuses become clogged or swollen. Sinus pain can result from various conditions that affect the sinuses, such as a cold, a sinus infection, or an allergy. Treatment for this condition depends on the cause. It may include medicine, such as antibiotics or antihistamines. This information is not intended to replace advice given to you by your health care provider. Make sure you discuss any questions you have with your health care provider. Document Revised: 09/03/2021 Document Reviewed: 09/03/2021 Elsevier Patient Education  2024 [employer].

## 2023-09-20 ENCOUNTER — Ambulatory Visit: Payer: Medicaid Other | Attending: Internal Medicine | Admitting: Internal Medicine

## 2023-09-20 ENCOUNTER — Encounter: Payer: Self-pay | Admitting: Internal Medicine

## 2023-09-20 VITALS — BP 97/67 | HR 92 | Temp 98.2°F | Ht 60.0 in | Wt 147.0 lb

## 2023-09-20 DIAGNOSIS — Z0000 Encounter for general adult medical examination without abnormal findings: Secondary | ICD-10-CM

## 2023-09-20 DIAGNOSIS — Z7689 Persons encountering health services in other specified circumstances: Secondary | ICD-10-CM

## 2023-09-20 DIAGNOSIS — J069 Acute upper respiratory infection, unspecified: Secondary | ICD-10-CM | POA: Diagnosis not present

## 2023-09-20 DIAGNOSIS — E663 Overweight: Secondary | ICD-10-CM

## 2023-09-20 DIAGNOSIS — H6690 Otitis media, unspecified, unspecified ear: Secondary | ICD-10-CM

## 2023-09-20 NOTE — Progress Notes (Signed)
Patient ID: Hailey Ferguson, female    DOB: 10/17/1985  MRN: [id number]  CC: Establish Care (Est care / new pt. /Reports 4 mo post-partum/Already received flu vax. )   Subjective: Hailey Ferguson is a 37 y.o. female who presents for new pt visit.  Patient originally from [place] Her concerns today include:   Discussed the use of AI scribe software for clinical note transcription with the patient, who gave verbal consent to proceed.  History of Present Illness   The patient, previously under the care of Dr. Karyl Kinnier at [facility], presents for a general physical and to establish care. She reports dissatisfaction with her previous provider due to lack of responsiveness and difficulty scheduling appointments. The patient denies any chronic medical conditions and is not on any chronic medications. She is currently taking prenatal vitamins and was recently prescribed antibiotics for an ear infection by a nurse at her workplace clinic at [employer].  The patient has been experiencing symptoms of an upper respiratory infection, including a runny nose, cough, and headache for approximately a week. She denies fever but reports occasional shortness of breath when coughing excessively. She has not been around anyone with similar symptoms. She was screened for COVID-19 five days ago at her workplace, and the test was negative. Despite ongoing symptoms, the patient reports feeling better since starting the antibiotics.  The patient recently had a baby four months ago and is still recovering from a tubal ligation procedure. She is breast feeding. She reports being slightly overweight but attributes this to recent childbirth and breastfeeding. She admits to consuming a lot of bread and rice and plans to control her diet after six months. She is not currently exercising due to recovery from the tubal ligation and caring for her newborn.      Patient Active Problem List   Diagnosis Date  Noted   Post term pregnancy over 40 weeks 05/07/2023   Unwanted fertility 01/16/2023   Advanced maternal age in multigravida 10/24/2022   Polyp at cervical os 10/24/2022   Vaginal bleeding during pregnancy, antepartum 10/24/2022   Supervision of high risk pregnancy, antepartum 09/26/2022     Current Outpatient Medications on File Prior to Visit  Medication Sig Dispense Refill   amoxicillin-clavulanate (AUGMENTIN) 875-125 MG tablet Take 1 tablet by mouth every 12 (twelve) hours for 10 days.     Prenatal Vit-Fe Fumarate-FA (MULTIVITAMIN-PRENATAL) 27-0.8 MG TABS tablet Take 1 tablet by mouth daily. 30 tablet 11   acetaminophen (TYLENOL) 325 MG tablet Take 2 tablets (650 mg total) by mouth every 4 (four) hours as needed (for pain scale < 4). (Patient not taking: Reported on 06/26/2023) 90 tablet 1   acetaminophen (TYLENOL) 500 MG tablet Take 2 tablets (1,000 mg total) by mouth every 6 (six) hours as needed for up to 3 days for mild pain (pain score 1-3) or moderate pain (pain score 4-6). (Patient not taking: Reported on 09/20/2023)     ascorbic acid (VITAMIN C) 500 MG tablet Take 1 tablet (500 mg total) by mouth every other day. Take with iron pill (Patient not taking: Reported on 06/26/2023) 30 tablet 3   benzocaine-Menthol (DERMOPLAST) 20-0.5 % AERO Apply 1 Application topically as needed (perineal discomfort). (Patient not taking: Reported on 06/26/2023)     coconut oil OIL Apply 1 Application topically as needed. (Patient not taking: Reported on 06/26/2023)     famotidine (PEPCID) 20 MG tablet Take 1 tablet by mouth twice daily (Patient not taking: Reported  on 06/26/2023) 60 tablet 0   ferrous sulfate 325 (65 FE) MG EC tablet Take 1 tablet (325 mg total) by mouth every other day. (Patient not taking: Reported on 06/26/2023) 30 tablet 2   hydrocortisone (ANUSOL-HC) 2.5 % rectal cream Place 1 Application rectally 2 (two) times daily. (Patient not taking: Reported on 06/26/2023) 30 g 0   ibuprofen (ADVIL)  600 MG tablet Take 1 tablet (600 mg total) by mouth every 6 (six) hours. (Patient not taking: Reported on 06/26/2023) 30 tablet 0   Magnesium 200 MG TABS Take 1-2 tablets (200-400 mg total) by mouth at bedtime as needed. (Patient not taking: Reported on 06/26/2023) 30 tablet 0   ondansetron (ZOFRAN) 4 MG tablet Take 1 tablet (4 mg total) by mouth every 8 (eight) hours as needed for nausea or vomiting. (Patient not taking: Reported on 06/26/2023) 20 tablet 3   promethazine (PHENERGAN) 25 MG tablet Take 1 tablet (25 mg total) by mouth every 6 (six) hours as needed for nausea or vomiting. (Patient not taking: Reported on 04/11/2023) 30 tablet 1   sodium chloride (OCEAN) 0.65 % SOLN nasal spray Place 2 sprays into both nostrils every 2 (two) hours while awake. (Patient not taking: Reported on 09/20/2023)     No current facility-administered medications on file prior to visit.    No Known Allergies  Social History   Socioeconomic History   Marital status: Married    Spouse name: Not on file   Number of children: 1   Years of education: completed high school   Highest education level: Not on file  Occupational History   Occupation: retail  Tobacco Use   Smoking status: Never    Passive exposure: Past (mother smoked at home)   Smokeless tobacco: Never  Vaping Use   Vaping status: Never Used  Substance and Sexual Activity   Alcohol use: No   Drug use: No   Sexual activity: Yes    Birth control/protection: Surgical  Other Topics Concern   Not on file  Social History Narrative   Lives at home with family.   Left-handed.   2-3 cups tea daily.   Social Determinants of Health   Financial Resource Strain: Low Risk  (09/20/2023)   Overall Financial Resource Strain (CARDIA)    Difficulty of Paying Living Expenses: Not very hard  Food Insecurity: No Food Insecurity (09/20/2023)   Hunger Vital Sign    Worried About Running Out of Food in the Last Year: Never true    Ran Out of Food in the Last  Year: Never true  Transportation Needs: No Transportation Needs (09/20/2023)   PRAPARE - Administrator, Civil Service (Medical): No    Lack of Transportation (Non-Medical): No  Physical Activity: Insufficiently Active (09/20/2023)   Exercise Vital Sign    Days of Exercise per Week: 2 days    Minutes of Exercise per Session: 10 min  Stress: No Stress Concern Present (09/20/2023)   [employer] of Occupational Health - Occupational Stress Questionnaire    Feeling of Stress : Not at all  Social Connections: Socially Isolated (09/20/2023)   Social Connection and Isolation Panel [NHANES]    Frequency of Communication with Friends and Family: Never    Frequency of Social Gatherings with Friends and Family: Twice a week    Attends Religious Services: Never    Database administrator or Organizations: No    Attends Banker Meetings: Never    Marital Status: Married  Intimate Partner Violence: Not At Risk (09/20/2023)   Humiliation, Afraid, Rape, and Kick questionnaire    Fear of Current or Ex-Partner: No    Emotionally Abused: No    Physically Abused: No    Sexually Abused: No    Family History  Problem Relation Age of Onset   Hypertension Father    Dementia Father    Dementia Mother     Past Surgical History:  Procedure Laterality Date   HIP ARTHROSCOPY Left 07/07/2018   Procedure: Left hip arthroscopic loose body removal;  Surgeon: Yolonda Kida, MD;  Location: [facility];  Service: Orthopedics;  Laterality: Left;  120 mins   HIP CLOSED REDUCTION Left 05/29/2018   Procedure: CLOSED REDUCTION HIP;  Surgeon: Roby Lofts, MD;  Location: [facility] OR;  Service: Orthopedics;  Laterality: Left;   TUBAL LIGATION Bilateral 05/09/2023   Procedure: POST PARTUM TUBAL LIGATION;  Surgeon: Federico Flake, MD;  Location: [facility] LD ORS;  Service: Gynecology;  Laterality: Bilateral;    ROS: Review of Systems  Constitutional:  Negative for activity change.  HENT:   Positive for ear pain (RT ear infection.  Currently on Augmentin). Negative for dental problem, sore throat and trouble swallowing.   Eyes:  Negative for visual disturbance.  Respiratory:  Positive for cough.   Cardiovascular:  Negative for chest pain.  Gastrointestinal:  Negative for abdominal pain and blood in stool.  Genitourinary:  Negative for difficulty urinating and dysuria.  Psychiatric/Behavioral:  Negative for dysphoric mood.    PHYSICAL EXAM: BP 97/67 (BP Location: Left Arm, Patient Position: Sitting, Cuff Size: Normal)   Pulse 92   Temp 98.2 F (36.8 C) (Oral)   Ht 5' (1.524 m)   Wt 147 lb (66.7 kg)   LMP 05/07/2023 (Exact Date) Comment: birth of child hasn't had menses  SpO2 98%   BMI 28.71 kg/m   Physical Exam  General appearance - alert, well appearing, middle-age female and in no distress Mental status - normal mood, behavior, speech, dress, motor activity, and thought processes Eyes - pupils equal and reactive, extraocular eye movements intact Ears -RT ear: Mild erythema noted of the ear canal and tympanic membrane. Nose - normal and patent, no erythema, discharge or polyps Mouth - mucous membranes moist, pharynx normal without lesions Neck - supple, no significant adenopathy Lymphatics - no palpable lymphadenopathy, no hepatosplenomegaly Chest - clear to auscultation, no wheezes, rales or rhonchi, symmetric air entry Heart - normal rate, regular rhythm, normal S1, S2, no murmurs, rubs, clicks or gallops Abdomen - soft, nontender, nondistended, no masses or organomegaly Neurological - cranial nerves II through XII intact, motor and sensory grossly normal bilaterally Musculoskeletal - no joint tenderness, deformity or swelling Extremities - peripheral pulses normal, no pedal edema, no clubbing or cyanosis    09/20/2023    9:03 AM 05/27/2023    1:53 PM 02/14/2023    8:41 AM  Depression screen PHQ 2/9  Decreased Interest 0 0 1  Down, Depressed, Hopeless 0 0 0  PHQ  - 2 Score 0 0 1  Altered sleeping 0 0 1  Tired, decreased energy 0 0 2  Change in appetite 0 0 0  Feeling bad or failure about yourself  0 0 0  Trouble concentrating 0 0 1  Moving slowly or fidgety/restless 0 0 0  Suicidal thoughts 0 0 0  PHQ-9 Score 0 0 5  Difficult doing work/chores Not difficult at all        09/20/2023  9:04 AM 05/27/2023    1:55 PM 02/14/2023    8:42 AM 10/24/2022    8:37 AM  GAD 7 : Generalized Anxiety Score  Nervous, Anxious, on Edge 0 0 0 0  Control/stop worrying 0 0 0 0  Worry too much - different things 0 0 0 0  Trouble relaxing 0 0 1 0  Restless 0 1 1 0  Easily annoyed or irritable 0 0 0 0  Afraid - awful might happen 0 0 1 0  Total GAD 7 Score 0 1 3 0  Anxiety Difficulty Not difficult at all             Latest Ref Rng & Units 10/24/2022    9:51 AM 06/05/2021    9:15 AM 06/04/2018   11:38 AM  CMP  Glucose 70 - 99 mg/dL 81  87  161   BUN 6 - 20 mg/dL 8  10  5    Creatinine 0.57 - 1.00 mg/dL 0.96  0.45  4.09   Sodium 134 - 144 mmol/L 138  138  139   Potassium 3.5 - 5.2 mmol/L 3.9  4.3  3.7   Chloride 96 - 106 mmol/L 103  104  108   CO2 20 - 29 mmol/L 19   23   Calcium 8.7 - 10.2 mg/dL 9.0  9.0  8.6   Total Protein 6.0 - 8.5 g/dL 6.5  7.2  6.2   Total Bilirubin 0.0 - 1.2 mg/dL 0.4  0.5  1.2   Alkaline Phos 44 - 121 IU/L 52  60  55   AST 0 - 40 IU/L 12  15  37   ALT 0 - 32 IU/L 10  14  38    Lipid Panel     Component Value Date/Time   CHOL 190 06/05/2021 0915   TRIG 56 06/05/2021 0915   HDL 55 06/05/2021 0915   CHOLHDL 3.5 06/05/2021 0915   LDLCALC 124 (H) 06/05/2021 0915    CBC    Component Value Date/Time   WBC 7.4 05/07/2023 0710   RBC 4.19 05/07/2023 0710   HGB 13.0 05/07/2023 0710   HGB 12.7 02/14/2023 0815   HCT 39.2 05/07/2023 0710   HCT 39.8 02/14/2023 0815   PLT 191 05/07/2023 0710   PLT 175 02/14/2023 0815   MCV 93.6 05/07/2023 0710   MCV 88 02/14/2023 0815   MCH 31.0 05/07/2023 0710   MCHC 33.2 05/07/2023 0710    RDW 14.4 05/07/2023 0710   RDW 14.5 02/14/2023 0815   LYMPHSABS 0.8 10/24/2022 0951   MONOABS 0.5 06/04/2018 1138   EOSABS 0.1 10/24/2022 0951   BASOSABS 0.0 10/24/2022 0951    ASSESSMENT AND PLAN: 1. Establishing care with new doctor, encounter for Patient declines routine blood tests.  2. Upper respiratory tract infection, unspecified type Recommend a tablespoon of honey as needed to help with cough.  Will not give Tessalon Perles as patient is breast-feeding.  Tylenol as needed for any aches or pains.  Recommends wearing a mask around family members and when out in public until cough resolves.  3. Acute otitis media, unspecified otitis media type Advised to complete Augmentin antibiotics.  Follow-up this symptoms do not resolve by the time of completion  4. Overweight (BMI 25.0-29.9) Discussed and encourage healthy eating habits and regular exercise.   Patient was given the opportunity to ask questions.  Patient verbalized understanding of the plan and was able to repeat key elements of the plan.   This  documentation was completed using Paediatric nurse.  Any transcriptional errors are unintentional.  No orders of the defined types were placed in this encounter.    Requested Prescriptions    No prescriptions requested or ordered in this encounter    Return if symptoms worsen or fail to improve.  Jonah Blue, MD, FACP

## 2023-09-20 NOTE — Patient Instructions (Signed)

## 2023-10-11 ENCOUNTER — Ambulatory Visit: Payer: Medicaid Other | Admitting: Internal Medicine

## 2023-12-04 ENCOUNTER — Ambulatory Visit: Payer: Medicaid Other | Attending: Nurse Practitioner | Admitting: Nurse Practitioner

## 2023-12-04 ENCOUNTER — Encounter: Payer: Self-pay | Admitting: Nurse Practitioner

## 2023-12-04 VITALS — BP 94/63 | HR 78 | Ht 60.0 in | Wt 154.6 lb

## 2023-12-04 DIAGNOSIS — G8929 Other chronic pain: Secondary | ICD-10-CM | POA: Diagnosis not present

## 2023-12-04 DIAGNOSIS — M545 Low back pain, unspecified: Secondary | ICD-10-CM | POA: Diagnosis not present
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
## 2024-01-21 ENCOUNTER — Ambulatory Visit

## 2024-01-21 VITALS — BP 99/67 | Temp 98.2°F

## 2024-01-21 DIAGNOSIS — R0981 Nasal congestion: Secondary | ICD-10-CM

## 2024-01-21 NOTE — Progress Notes (Signed)
 C/O severe allergy symptoms, this nurse gave one loratidine and Tylenol for her headache, refresh eye gtts for relief.

## 2024-04-14 ENCOUNTER — Ambulatory Visit: Attending: Internal Medicine | Admitting: Internal Medicine

## 2024-04-14 VITALS — BP 97/64 | HR 69 | Temp 98.0°F | Ht 60.0 in | Wt 145.0 lb

## 2024-04-14 DIAGNOSIS — N3946 Mixed incontinence: Secondary | ICD-10-CM | POA: Diagnosis not present

## 2024-04-14 NOTE — Progress Notes (Signed)
 Patient ID: Hailey Ferguson, female    DOB: 03-01-1986  MRN: [id number]  CC: Follow-up (Follow-up/Urinary incontinence X11 mo/Discuss HPV vax )   Subjective: Hailey Ferguson is a 38 y.o. female who presents for chronic ds management.  Husband is with her.  Her concerns today include:   C/o urinary incontinence x 11 mth after giving birth to her 2nd child.  Most noticeable when laughing or sneezing but also reports episodes where she is not always able to hold the urine completely before getting to the restroom.  She is inquiring about whether HPV vaccine would be indicated.  Had polyp removed from her cervix after giving birth.  Normal Pap smear was done January of last year with negative HPV.  She is in a monogamous relationship with her husband only.   Patient Active Problem List   Diagnosis Date Noted   Post term pregnancy over 40 weeks 05/07/2023   Unwanted fertility 01/16/2023   Advanced maternal age in multigravida 10/24/2022   Polyp at cervical os 10/24/2022   Vaginal bleeding during pregnancy, antepartum 10/24/2022   Supervision of high risk pregnancy, antepartum 09/26/2022     Current Outpatient Medications on File Prior to Visit  Medication Sig Dispense Refill   acetaminophen  (TYLENOL ) 325 MG tablet Take 2 tablets (650 mg total) by mouth every 4 (four) hours as needed (for pain scale < 4). (Patient not taking: Reported on 04/14/2024) 90 tablet 1   ascorbic acid  (VITAMIN C) 500 MG tablet Take 1 tablet (500 mg total) by mouth every other day. Take with iron pill (Patient not taking: Reported on 04/14/2024) 30 tablet 3   benzocaine -Menthol  (DERMOPLAST) 20-0.5 % AERO Apply 1 Application topically as needed (perineal discomfort). (Patient not taking: Reported on 04/14/2024)     coconut oil OIL Apply 1 Application topically as needed. (Patient not taking: Reported on 04/14/2024)     famotidine  (PEPCID ) 20 MG tablet Take 1 tablet by mouth twice daily (Patient not taking: Reported on  04/14/2024) 60 tablet 0   ferrous sulfate  325 (65 FE) MG EC tablet Take 1 tablet (325 mg total) by mouth every other day. (Patient not taking: Reported on 04/14/2024) 30 tablet 2   hydrocortisone  (ANUSOL -HC) 2.5 % rectal cream Place 1 Application rectally 2 (two) times daily. (Patient not taking: Reported on 04/14/2024) 30 g 0   ibuprofen  (ADVIL ) 600 MG tablet Take 1 tablet (600 mg total) by mouth every 6 (six) hours. (Patient not taking: Reported on 04/14/2024) 30 tablet 0   Magnesium  200 MG TABS Take 1-2 tablets (200-400 mg total) by mouth at bedtime as needed. (Patient not taking: Reported on 04/14/2024) 30 tablet 0   ondansetron  (ZOFRAN ) 4 MG tablet Take 1 tablet (4 mg total) by mouth every 8 (eight) hours as needed for nausea or vomiting. (Patient not taking: Reported on 04/14/2024) 20 tablet 3   Prenatal Vit-Fe Fumarate-FA (MULTIVITAMIN-PRENATAL) 27-0.8 MG TABS tablet Take 1 tablet by mouth daily. (Patient not taking: Reported on 04/14/2024) 30 tablet 11   promethazine  (PHENERGAN ) 25 MG tablet Take 1 tablet (25 mg total) by mouth every 6 (six) hours as needed for nausea or vomiting. (Patient not taking: Reported on 04/14/2024) 30 tablet 1   sodium chloride  (OCEAN) 0.65 % SOLN nasal spray Place 2 sprays into both nostrils every 2 (two) hours while awake. (Patient not taking: Reported on 04/14/2024)     No current facility-administered medications on file prior to visit.    No Known Allergies  Social History  Socioeconomic History   Marital status: Married    Spouse name: Not on file   Number of children: 1   Years of education: completed high school   Highest education level: Not on file  Occupational History   Occupation: retail  Tobacco Use   Smoking status: Never    Passive exposure: Past (mother smoked at home)   Smokeless tobacco: Never  Vaping Use   Vaping status: Never Used  Substance and Sexual Activity   Alcohol use: No   Drug use: No   Sexual activity: Yes    Birth  control/protection: Surgical  Other Topics Concern   Not on file  Social History Narrative   Lives at home with family.   Left-handed.   2-3 cups tea daily.   Social Drivers of Corporate investment banker Strain: Low Risk  (09/20/2023)   Overall Financial Resource Strain (CARDIA)    Difficulty of Paying Living Expenses: Not very hard  Food Insecurity: No Food Insecurity (09/20/2023)   Hunger Vital Sign    Worried About Running Out of Food in the Last Year: Never true    Ran Out of Food in the Last Year: Never true  Transportation Needs: No Transportation Needs (09/20/2023)   PRAPARE - Administrator, Civil Service (Medical): No    Lack of Transportation (Non-Medical): No  Physical Activity: Insufficiently Active (09/20/2023)   Exercise Vital Sign    Days of Exercise per Week: 2 days    Minutes of Exercise per Session: 10 min  Stress: No Stress Concern Present (09/20/2023)   [employer] of Occupational Health - Occupational Stress Questionnaire    Feeling of Stress : Not at all  Social Connections: Socially Isolated (09/20/2023)   Social Connection and Isolation Panel    Frequency of Communication with Friends and Family: Never    Frequency of Social Gatherings with Friends and Family: Twice a week    Attends Religious Services: Never    Database administrator or Organizations: No    Attends Banker Meetings: Never    Marital Status: Married  Catering manager Violence: Not At Risk (09/20/2023)   Humiliation, Afraid, Rape, and Kick questionnaire    Fear of Current or Ex-Partner: No    Emotionally Abused: No    Physically Abused: No    Sexually Abused: No    Family History  Problem Relation Age of Onset   Hypertension Father    Dementia Father    Dementia Mother     Past Surgical History:  Procedure Laterality Date   HIP ARTHROSCOPY Left 07/07/2018   Procedure: Left hip arthroscopic loose body removal;  Surgeon: Sharl Selinda Dover, MD;   Location: [facility] OR;  Service: Orthopedics;  Laterality: Left;  120 mins   HIP CLOSED REDUCTION Left 05/29/2018   Procedure: CLOSED REDUCTION HIP;  Surgeon: Kendal Franky SQUIBB, MD;  Location: [facility] OR;  Service: Orthopedics;  Laterality: Left;   TUBAL LIGATION Bilateral 05/09/2023   Procedure: POST PARTUM TUBAL LIGATION;  Surgeon: Eldonna Suzen Octave, MD;  Location: [facility] LD ORS;  Service: Gynecology;  Laterality: Bilateral;    ROS: Review of Systems Negative except as stated above  PHYSICAL EXAM: BP 97/64 (BP Location: Left Arm, Patient Position: Sitting, Cuff Size: Normal)   Pulse 69   Temp 98 F (36.7 C) (Oral)   Ht 5' (1.524 m)   Wt 145 lb (65.8 kg)   SpO2 98%   BMI 28.32 kg/m   Physical Exam  General appearance - alert, well appearing, and in no distress Mental status - normal mood, behavior, speech, dress, motor activity, and thought processes      Latest Ref Rng & Units 10/24/2022    9:51 AM 06/05/2021    9:15 AM 06/04/2018   11:38 AM  CMP  Glucose 70 - 99 mg/dL 81  87  893   BUN 6 - 20 mg/dL 8  10  5    Creatinine 0.57 - 1.00 mg/dL 9.43  9.27  9.25   Sodium 134 - 144 mmol/L 138  138  139   Potassium 3.5 - 5.2 mmol/L 3.9  4.3  3.7   Chloride 96 - 106 mmol/L 103  104  108   CO2 20 - 29 mmol/L 19   23   Calcium 8.7 - 10.2 mg/dL 9.0  9.0  8.6   Total Protein 6.0 - 8.5 g/dL 6.5  7.2  6.2   Total Bilirubin 0.0 - 1.2 mg/dL 0.4  0.5  1.2   Alkaline Phos 44 - 121 IU/L 52  60  55   AST 0 - 40 IU/L 12  15  37   ALT 0 - 32 IU/L 10  14  38    Lipid Panel     Component Value Date/Time   CHOL 190 06/05/2021 0915   TRIG 56 06/05/2021 0915   HDL 55 06/05/2021 0915   CHOLHDL 3.5 06/05/2021 0915   LDLCALC 124 (H) 06/05/2021 0915    CBC    Component Value Date/Time   WBC 7.4 05/07/2023 0710   RBC 4.19 05/07/2023 0710   HGB 13.0 05/07/2023 0710   HGB 12.7 02/14/2023 0815   HCT 39.2 05/07/2023 0710   HCT 39.8 02/14/2023 0815   PLT 191 05/07/2023 0710   PLT 175 02/14/2023 0815    MCV 93.6 05/07/2023 0710   MCV 88 02/14/2023 0815   MCH 31.0 05/07/2023 0710   MCHC 33.2 05/07/2023 0710   RDW 14.4 05/07/2023 0710   RDW 14.5 02/14/2023 0815   LYMPHSABS 0.8 10/24/2022 0951   MONOABS 0.5 06/04/2018 1138   EOSABS 0.1 10/24/2022 0951   BASOSABS 0.0 10/24/2022 0951    ASSESSMENT AND PLAN: 1. 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  Recommend trying Kegel's exercises.  Went over with her what Kegel's exercises are and how to do them.  Printed information also provided.  It is not clear that Vesicare would be safe in breast-feeding female so we decided to hold off on prescribing this. - We did shared decision-making about whether to initiate the HPV vaccine series.  She is 37.  Advised that it is best to start the vaccine series in the teenage years before becoming sexually active.  We decided to hold off on giving at this time.    Patient was given the opportunity to ask questions.  Patient verbalized understanding of the plan and was able to repeat key elements of the plan.   This documentation was completed using Paediatric nurse.  Any transcriptional errors are unintentional.  No orders of the defined types were placed in this encounter.    Requested Prescriptions    No prescriptions requested or ordered in this encounter    Return in about 6 months (around 10/15/2024) for physical.  Barnie Louder, MD, GENI

## 2024-04-14 NOTE — Patient Instructions (Signed)
Kegel Exercises  Kegel exercises can help strengthen your pelvic floor muscles. The pelvic floor is a group of muscles that support your rectum, small intestine, and bladder. In females, pelvic floor muscles also help support the uterus. These muscles help you control the flow of urine and stool (feces). Kegel exercises are painless and simple. They do not require any equipment. Your provider may suggest Kegel exercises to: Improve bladder and bowel control. Improve sexual response. Improve weak pelvic floor muscles after surgery to remove the uterus (hysterectomy) or after pregnancy, in females. Improve weak pelvic floor muscles after prostate gland removal or surgery, in males. Kegel exercises involve squeezing your pelvic floor muscles. These are the same muscles you squeeze when you try to stop the flow of urine or keep from passing gas. The exercises can be done while sitting, standing, or lying down, but it is best to vary your position. Ask your health care provider which exercises are safe for you. Do exercises exactly as told by your health care provider and adjust them as directed. Do not begin these exercises until told by your health care provider. Exercises How to do Kegel exercises: Squeeze your pelvic floor muscles tight. You should feel a tight lift in your rectal area. If you are a female, you should also feel a tightness in your vaginal area. Keep your stomach, buttocks, and legs relaxed. Hold the muscles tight for up to 10 seconds. Breathe normally. Relax your muscles for up to 10 seconds. Repeat as told by your health care provider. Repeat this exercise daily as told by your health care provider. Continue to do this exercise for at least 4-6 weeks, or for as long as told by your health care provider. You may be referred to a physical therapist who can help you learn more about how to do Kegel exercises. Depending on your condition, your health care provider may  recommend: Varying how long you squeeze your muscles. Doing several sets of exercises every day. Doing exercises for several weeks. Making Kegel exercises a part of your regular exercise routine. This information is not intended to replace advice given to you by your health care provider. Make sure you discuss any questions you have with your health care provider. Document Revised: 02/09/2021 Document Reviewed: 02/09/2021 Elsevier Patient Education  2024 Elsevier Inc.  

## 2024-08-20 ENCOUNTER — Ambulatory Visit: Admitting: General Practice

## 2024-08-20 ENCOUNTER — Ambulatory Visit: Admitting: Registered Nurse

## 2024-08-20 VITALS — BP 99/57 | HR 76 | Temp 99.3°F | Resp 16

## 2024-08-20 DIAGNOSIS — M65931 Unspecified synovitis and tenosynovitis, right forearm: Secondary | ICD-10-CM

## 2024-08-20 DIAGNOSIS — Z23 Encounter for immunization: Secondary | ICD-10-CM

## 2024-08-20 DIAGNOSIS — M25531 Pain in right wrist: Secondary | ICD-10-CM

## 2024-08-20 NOTE — Progress Notes (Signed)
 Established Patient Office Visit  Subjective   Patient ID: Hailey Ferguson, female    DOB: 12/05/1985  Age: 38 y.o. MRN: [id number]  Chief Complaint  Patient presents with   Pain    Pain right wrist and thumb    38y/o established patient here for evaluation acute right wrist and thumb pain.  Started 4 days ago.  Has tried OTC wrist splint from [employer] and tylenol .  Not improving.  Denied known injury.  Denied new hobbies/exercise programs or recent overtime work.  Denied rash/bruising.  Has had some swelling at wrist and pain shooting into fingers with squeezing packing peanut release mechanism typically uses right hand.  She is left hand dominant.  She also notices worsening pain with flexion/hyperextension right wrist or supination of forearm/turning doorknobs.        Review of Systems  Constitutional:  Negative for chills and fever.  Musculoskeletal:  Positive for joint pain and myalgias. Negative for back pain, falls and neck pain.  Skin:  Negative for itching and rash.  Neurological:  Positive for tingling. Negative for tremors, sensory change, focal weakness, weakness and headaches.  Endo/Heme/Allergies:  Does not bruise/bleed easily.      Objective:     BP (!) 99/57 (BP Location: Left Arm, Patient Position: Sitting, Cuff Size: Normal)   Pulse 76   Temp 99.3 F (37.4 C) (Tympanic)   Resp 16   SpO2 97%    Physical Exam Vitals and nursing note reviewed.  Constitutional:      General: She is awake. She is not in acute distress.    Appearance: Normal appearance. She is well-developed, well-groomed and normal weight. She is not ill-appearing, toxic-appearing or diaphoretic.  HENT:     Head: Normocephalic and atraumatic.     Jaw: There is normal jaw occlusion.     Salivary Glands: Right salivary gland is not diffusely enlarged. Left salivary gland is not diffusely enlarged.     Right Ear: Hearing and external ear normal.     Left Ear: Hearing and external ear  normal.     Nose: Nose normal. No congestion or rhinorrhea.     Mouth/Throat:     Lips: Pink. No lesions.     Mouth: Mucous membranes are moist.     Pharynx: Oropharynx is clear.  Eyes:     General: Lids are normal. Vision grossly intact. Gaze aligned appropriately. No scleral icterus.       Right eye: No discharge.        Left eye: No discharge.     Extraocular Movements: Extraocular movements intact.     Conjunctiva/sclera: Conjunctivae normal.     Pupils: Pupils are equal, round, and reactive to light.  Neck:     Trachea: Trachea normal.  Cardiovascular:     Rate and Rhythm: Normal rate and regular rhythm.     Pulses: Normal pulses.  Pulmonary:     Effort: Pulmonary effort is normal. No respiratory distress.     Breath sounds: Normal breath sounds and air entry. No stridor or transmitted upper airway sounds. No wheezing.     Comments: Spoke full sentences without difficulty; no cough observed in exam room Abdominal:     General: Abdomen is flat.  Musculoskeletal:        General: Tenderness present. No swelling or deformity.     Right shoulder: No swelling, deformity, effusion, laceration, tenderness or crepitus. Normal range of motion. Normal strength.     Left shoulder: No swelling, deformity,  effusion, laceration, tenderness or crepitus. Normal range of motion. Normal strength.     Right elbow: No swelling, deformity, effusion or lacerations. Normal range of motion.     Left elbow: No swelling, deformity, effusion or lacerations. Normal range of motion.     Right forearm: No swelling, edema, deformity, lacerations or tenderness.     Left forearm: No swelling, edema, deformity, lacerations or tenderness.     Right wrist: Swelling and tenderness present. No deformity, effusion, lacerations, bony tenderness, snuff box tenderness or crepitus. Decreased range of motion.     Left wrist: No swelling, deformity, effusion, lacerations, tenderness, bony tenderness, snuff box tenderness or  crepitus. Normal range of motion.     Right hand: Swelling and tenderness present. No deformity, lacerations or bony tenderness. Decreased range of motion. Normal strength. Normal sensation. Normal capillary refill.     Left hand: No swelling, deformity, lacerations, tenderness or bony tenderness. Normal range of motion. Normal strength. Normal sensation. Normal capillary refill.     Cervical back: Normal range of motion and neck supple. No swelling, edema, deformity, erythema, signs of trauma, lacerations, rigidity, spasms, torticollis, tenderness, bony tenderness or crepitus. No pain with movement. Normal range of motion.     Thoracic back: No swelling, edema, deformity, signs of trauma or lacerations. Normal range of motion.     Right lower leg: No edema.     Left lower leg: No edema.     Comments: Positive finkelsteins, tinnels and phalens tests right only; right wrist decrease AROM compared to left flexion 5 degrees; trace nonpitting edema right wrist  Lymphadenopathy:     Head:     Right side of head: No submandibular or preauricular adenopathy.     Left side of head: No submandibular or preauricular adenopathy.     Cervical: No cervical adenopathy.     Right cervical: No superficial cervical adenopathy.    Left cervical: No superficial cervical adenopathy.  Skin:    General: Skin is warm and dry.     Capillary Refill: Capillary refill takes less than 2 seconds.     Coloration: Skin is not ashen, cyanotic, jaundiced, mottled, pale or sallow.     Findings: No abrasion, abscess, acne, bruising, burn, ecchymosis, erythema, signs of injury, laceration, lesion, petechiae, rash or wound.     Comments: Right forearm/bilateral hands/anterior face/neck visually inspected  Neurological:     General: No focal deficit present.     Mental Status: She is alert and oriented to person, place, and time. Mental status is at baseline.     GCS: GCS eye subscore is 4. GCS verbal subscore is 5. GCS motor  subscore is 6.     Cranial Nerves: No cranial nerve deficit, dysarthria or facial asymmetry.     Motor: Motor function is intact. No weakness, tremor, atrophy, abnormal muscle tone or seizure activity.     Coordination: Coordination is intact. Coordination normal.     Gait: Gait is intact. Gait normal.     Comments: In/out of chair without difficulty; gait sure and steady in clinic; bilateral hand grasp equal 5/5  Psychiatric:        Attention and Perception: Attention and perception normal.        Mood and Affect: Mood and affect normal.        Speech: Speech normal.        Behavior: Behavior normal. Behavior is cooperative.        Thought Content: Thought content normal.  Cognition and Memory: Cognition and memory normal.        Judgment: Judgment normal.      No results found for any visits on 08/20/24.    The ASCVD Risk score (Arnett DK, et al., 2019) failed to calculate for the following reasons:   The 2019 ASCVD risk score is only valid for ages 24 to 83    Assessment & Plan:   Problem List Items Addressed This Visit   None Visit Diagnoses       Tenosynovitis of right wrist    -  Primary     Need for prophylactic vaccination and inoculation against influenza         Wrist pain, acute, right         Fitted and distributed right thumb spica wrist splint medium from clinic stock.  Patient reported she has ice pack at home  Discussed may hand wash and air dry or use blowdryer but do not place in drying machine for clothes as can melt plastic.  biofreeze gel apply QID prn pain Breastfeeding does not want to take any medication except tylenol  1000mg  po q6h prn pain Discussed moderate phalen's positive wear splint at night and wear right when at work due to dequervains tenosynovitis  Discussed flu vaccination no contraindications  Patient was instructed to rest, ice and elevate arms if swelling noted after work.  Cryotherapy 15 minutes TID prn pain/swelling. [employer] handout  on carpal tunnel, wrist  pain, dequervains tenosynovitis, flu vis printed and given to patient. Medications as directed.  Suspect overuse soft tissue injury related pain due to repetitive motion  Call or return to clinic as needed if these symptoms worsen  or fail to improve as anticipated and will consider PT and orthopedics evaluation. Discussed I am unable to write work restrictions in this clinic due to contract limitations.  Will need to see [facility] if unable to perform work duties with splint on. Next follow up evaluation to be scheduled  in 2 weeks.  Discussed tendon inflamed and tylenol , elevation, stretches, splint /ice help to improve.  Avoid starting new activities that utilize hands in the next two weeks and then only 15 minutes per day after that time of rest.  Patient verbalized agreement and understanding of treatment plan. P2: ROM, injury prevention    See immunizations tab RN Karene administered seasonal flu vaccine in clinic today.  Return in about 2 weeks (around 09/03/2024), or if symptoms worsen or fail to improve.    Hailey DELENA Hope, NP

## 2024-08-20 NOTE — Patient Instructions (Addendum)
 Influenza (Flu) Vaccine (Inactivated or Recombinant): What You Need to Know Many vaccine information statements are available in Spanish and other languages. See promoage.com.br. 1. Why get vaccinated? Influenza vaccine can prevent influenza (flu). Flu is a contagious disease that spreads around the [place]  every year, usually between October and May. Anyone can get the flu, but it is more dangerous for some people. Infants and young children, people 23 years and older, pregnant people, and people with certain health conditions or a weakened immune system are at greatest risk of flu complications. Pneumonia, bronchitis, sinus infections, and ear infections are examples of flu-related complications. If you have a medical condition, such as heart disease, cancer, or diabetes, flu can make it worse. Flu can cause fever and chills, sore throat, muscle aches, fatigue, cough, headache, and runny or stuffy nose. Some people may have vomiting and diarrhea, though this is more common in children than adults. In an average year, thousands of people in the [place]  die from flu, and many more are hospitalized. Flu vaccine prevents millions of illnesses and flu-related visits to the doctor each year. 2. Influenza vaccines CDC recommends everyone 6 months and older get vaccinated every flu season. Children 6 months through 48 years of age may need 2 doses during a single flu season. Everyone else needs only 1 dose each flu season. It takes about 2 weeks for protection to develop after vaccination. There are many flu viruses, and they are always changing. Each year a new flu vaccine is made to protect against the influenza viruses believed to be likely to cause disease in the upcoming flu season. Even when the vaccine doesn't exactly match these viruses, it may still provide some protection. Influenza vaccine does not cause flu. Influenza vaccine may be given at the same time as other vaccines. 3.  Talk with your health care provider Tell your vaccination provider if the person getting the vaccine: Has had an allergic reaction after a previous dose of influenza vaccine, or has any severe, life-threatening allergies Has ever had Guillain-Barr Syndrome (also called GBS) In some cases, your health care provider may decide to postpone influenza vaccination until a future visit. Influenza vaccine can be administered at any time during pregnancy. People who are or will be pregnant during influenza season should receive inactivated influenza vaccine. People with minor illnesses, such as a cold, may be vaccinated. People who are moderately or severely ill should usually wait until they recover before getting influenza vaccine. Your health care provider can give you more information. 4. Risks of a vaccine reaction Soreness, redness, and swelling where the shot is given, fever, muscle aches, and headache can happen after influenza vaccination. There may be a very small increased risk of Guillain-Barr Syndrome (GBS) after inactivated influenza vaccine (the flu shot). Young children who get the flu shot along with pneumococcal vaccine (PCV13) and/or DTaP vaccine at the same time might be slightly more likely to have a seizure caused by fever. Tell your health care provider if a child who is getting flu vaccine has ever had a seizure. People sometimes faint after medical procedures, including vaccination. Tell your provider if you feel dizzy or have vision changes or ringing in the ears. As with any medicine, there is a very remote chance of a vaccine causing a severe allergic reaction, other serious injury, or death. 5. What if there is a serious problem? An allergic reaction could occur after the vaccinated person leaves the clinic. If you see signs of  a severe allergic reaction (hives, swelling of the face and throat, difficulty breathing, a fast heartbeat, dizziness, or weakness), call 9-1-1 and get  the person to the nearest hospital. For other signs that concern you, call your health care provider. Adverse reactions should be reported to the Vaccine Adverse Event Reporting System (VAERS). Your health care provider will usually file this report, or you can do it yourself. Visit the VAERS website at www.vaers.lagents.no or call (838)424-7466. VAERS is only for reporting reactions, and VAERS staff members do not give medical advice. 6. The National Vaccine Injury Compensation Program The [employer] Vaccine Injury Compensation Program (VICP) is a federal program that was created to compensate people who may have been injured by certain vaccines. Claims regarding alleged injury or death due to vaccination have a time limit for filing, which may be as short as two years. Visit the VICP website at spiritualword.at or call (684)220-0137 to learn about the program and about filing a claim. 7. How can I learn more? Ask your health care provider. Call your local or state health department. Visit the website of the Food and Drug Administration (FDA) for vaccine package inserts and additional information at finderlist.no. Contact the Centers for Disease Control and Prevention (CDC): Call 504-226-1198 (1-800-CDC-INFO) or Visit CDC's website at biotechroom.com.cy. Source: CDC Vaccine Information Statement Inactivated Influenza Vaccine (05/20/2020) This same material is available at footballexhibition.com.br for no charge. This information is not intended to replace advice given to you by your health care provider. Make sure you discuss any questions you have with your health care provider. Document Revised: 01/16/2023 Document Reviewed: 10/22/2022 Elsevier Patient Education  2024 [employer].Therapy Putty Exercises This video shows you how to do hand exercises using therapy putty. To view the content, go to this web address: [email or web address]  This video will  expire on: 09/25/2025. If you need access to this video following this date, please reach out to the healthcare provider who assigned it to you. This information is not intended to replace advice given to you by your health care provider. Make sure you discuss any questions you have with your health care provider. Elsevier Patient Education  2024 [employer].Pinched Nerve in the Wrist (Carpal Tunnel Syndrome): What to Know  Pinched nerve in the wrist (carpal tunnel syndrome, or CTS) is a nerve problem that causes pain, numbness, and weakness in the wrist, hand, and fingers. The carpal tunnel is a narrow space that is on the palm side of your wrist. Repeated wrist motions or certain diseases may cause swelling in the tunnel. This swelling can pinch the main nerve in the wrist (the median nerve). What are the causes? CTS may be caused by: Moving your hand and wrist over and over again while doing a task. Hurting the wrist. Arthritis. A pocket of fluid (cyst) or a growth (tumor) in the carpal tunnel. Fluid buildup when you are pregnant. Use of tools that vibrate. In some cases, the cause of CTS is not known. What increases the risk? You're more likely to have CTS if: You have a job that makes you do these things: Move your hand firmly over and over again. Work with tools that vibrate, such as drills or sanders. You're female. You have diabetes, obesity, thyroid problems, or kidney failure. What are the signs or symptoms? Symptoms of this condition include: A tingling feeling in your fingers. You may feel this pain in the thumb, index finger, or middle finger. Tingling or loss of feeling in your hand. Pain  in your entire arm. This pain may get worse when you bend your wrist and elbow for a long time. Pain in your wrist that goes up your arm to your shoulder. Pain that goes down into your palm or fingers. Weakness in your hands. You may find it hard to grab and hold items. Your symptoms  may feel worse during the night. How is this diagnosed? CTS is diagnosed with a medical history and physical exam. Tests and imaging may also be done to: Check the electrical signals sent by your nerves into the muscles. Check how well electrical signals pass through your nerves. Check possible causes of your CTS. These include X-rays, ultrasound, and MRI. How is this treated? CTS may be treated with: Lifestyle changes. You will be asked to stop or change the activity that caused your problem. Physical therapy. This may include: Exercises that stretch and strengthen the muscles and tendons in the wrist and hand. Nerve gliding or flossing exercises. These help keep nerves moving smoothly through the tissues around them. Occupational therapy. You'll learn how to use your hand again. Medicines for pain and swelling. You may have injections in your wrist. A wrist splint or brace. Surgery. Follow these instructions at home: If you have a splint or brace: Wear the splint or brace as told. Take it off only if your provider says you can. Check the skin around it every day. Tell your provider if you see problems. Loosen the splint or brace if your fingers tingle, are numb, or turn cold and blue. Keep the splint or brace clean and dry. If the splint or brace isn't waterproof: Do not let it get wet. Cover it when you take a bath or shower. Use a cover that doesn't let any water in. Managing pain, stiffness, and swelling  Use ice or an ice pack as told. If you have a splint or brace that you can take off, remove it only as told. Place a towel between your skin and the ice. Leave the ice on for 20 minutes, 2-3 times a day. If your skin turns red, take off the ice right away to prevent skin damage. The risk of damage is higher if you can't feel pain, heat, or cold. Move your fingers often to reduce stiffness and swelling. General instructions Take your medicines only as told. Rest your wrist and  hand from activity that may cause pain. If your CTS is caused by things you do at work, talk with your employer about making changes. For example, you may need a wrist pad to use while typing. Exercise as told. Follow instructions on how to do nerve gliding or flossing exercises. These help keep nerves in moving smoothly through the tissues around them. Keep all follow-up visits. This is important. Where to find more information American Academy of Orthopedic Surgeons: orthoinfo.aaos.[employer] of Neurological Disorders and Stroke: basicfm.no Contact a health care provider if: You have new symptoms. Your pain is not controlled with medicines. Your symptoms get worse. Get help right away if: Your hand or wrist tingles or is numb, and the symptoms become very bad. This information is not intended to replace advice given to you by your health care provider. Make sure you discuss any questions you have with your health care provider. Document Revised: 08/13/2023 Document Reviewed: 05/31/2023 Elsevier Patient Education  2024 Else[employer] Inc.Irritation and Swelling of the Thumb Mare Quervain's Syndrome): What to Know  Everitt Quervain's syndrome causes irritation and swelling of the tendon on  the thumb side of the wrist. Tendons are strong tissues that connect muscle to bone.  The tendons in the hand go through a tunnel called a sheath. A slippery layer of tissue helps the tendons move through the sheath. With de Quervain's syndrome, the sheath swells or thickens. This can cause pressure and pain. De Quervain's syndrome is also called de Quervain's disease or de Quervain's tenosynovitis. What are the causes? The exact cause isn't known. It may be from overuse of the hand and wrist. What increases the risk? You're more likely to get R.r. Donnelley syndrome if: You use your hands far more than normal. This includes if you do certain movements over and over, such as twisting your hand or using a  tight grip. You're pregnant. You're female and middle-aged. You have rheumatoid arthritis. You have diabetes. What are the signs or symptoms? The main symptom is pain on the thumb side of your wrist. The pain may get worse when you grasp something or turn your wrist. You may also have: Pain that goes up your forearm. Swelling of your wrist and hand. Trouble moving your thumb and wrist. A feeling of snapping in the wrist. A cyst. This is a pocket of fluid. How is this diagnosed? You may be diagnosed based on your symptoms, medical history, and an exam. The exam may include a Terrilee test. This is when your health care provider pulls your thumb and wrist to see if it causes pain. You may also have tests. These may include: An X-ray. An MRI. An ultrasound. How is this treated? You may need to: Avoid doing things that: Cause pain or swelling. Cause you to make the same movements with your hand or thumb over and over. Take medicines. These may include: Medicines to help with pain. Steroids to help with irritation and swelling. Wear a splint. Have surgery. This may be needed if other treatments don't work. Once the pain and swelling have gone down, you may start: Physical therapy. You may be given exercises to help with movement and strength in your wrist and thumb. Occupational therapy. This includes changing how you move your wrist. Follow these instructions at home: If you have a splint: Wear the splint as told. Take it off only if your provider says you can. Check the skin around it every day. Tell your provider if you see problems. Loosen the splint if your fingers tingle, are numb, or turn cold and blue. Keep the splint clean. If the splint isn't waterproof: Do not let it get wet. Cover it when you take a bath or shower. Use a cover that doesn't let any water in. Managing pain, stiffness, and swelling  Use ice or an ice pack as told. If you have a splint that you can  take off, remove it only as told. Place a towel between your skin and the ice. Leave the ice on for 20 minutes, 2-3 times a day. If your skin turns red, take off the ice right away to prevent skin damage. The risk of damage is higher if you can't feel pain, heat, or cold. Move your fingers often to reduce stiffness and swelling. Raise your hand above the level of your heart while you're sitting or lying down. Use pillows as needed. General instructions Take your medicines only as told. Ask if it's OK for you to lift. Ask when it's safe to drive if you have a splint on your hand. Ask what things are safe for you to do at  home. Ask when you can go back to work or school. Where to find more information To learn more: Go to orthoinfo.aaos.org. Click Search. Type De Quervain's tenosynovitis into the search bar. Contact a health care provider if: Your pain doesn't get better with medicine. Your pain gets worse. You get new symptoms. This information is not intended to replace advice given to you by your health care provider. Make sure you discuss any questions you have with your health care provider. Document Revised: 05/27/2023 Document Reviewed: 05/27/2023 Elsevier Patient Education  2024 [employer].

## 2024-10-16 ENCOUNTER — Ambulatory Visit: Attending: Internal Medicine | Admitting: Internal Medicine

## 2024-10-16 ENCOUNTER — Encounter: Payer: Self-pay | Admitting: Internal Medicine

## 2024-10-16 VITALS — BP 97/62 | HR 79 | Ht 60.0 in | Wt 147.0 lb

## 2024-10-16 DIAGNOSIS — Z0000 Encounter for general adult medical examination without abnormal findings: Secondary | ICD-10-CM

## 2024-10-16 DIAGNOSIS — E663 Overweight: Secondary | ICD-10-CM | POA: Diagnosis not present

## 2024-10-16 DIAGNOSIS — G5601 Carpal tunnel syndrome, right upper limb: Secondary | ICD-10-CM | POA: Diagnosis not present

## 2024-10-16 DIAGNOSIS — M65931 Unspecified synovitis and tenosynovitis, right forearm: Secondary | ICD-10-CM

## 2024-10-16 DIAGNOSIS — Z23 Encounter for immunization: Secondary | ICD-10-CM

## 2024-10-16 NOTE — Progress Notes (Signed)
 "   Patient ID: Hailey Ferguson, female    DOB: May 25, 1986  MRN: [id number]  CC: Annual Exam (Physical. /On-going pain on RT wrist - wrist splint not alleviating /Discuss HPV vax/Already received flu vax, )   Subjective: Hailey Ferguson is a 39 y.o. female who presents for annual exam Her concerns today include:  Hx of mix urinary incontinence Discussed the use of AI scribe software for clinical note transcription with the patient, who gave verbal consent to proceed.  History of Present Illness Hailey Ferguson is a 39 year old female who presents for an annual physical exam.  She has been experiencing issues with her right wrist for approximately two to three months. 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 She experiences intermittent pain on the radial aspect of the wrist, occasional swelling, and tingling, particularly in the thumb and fingers, often when sleeping with her hand under her head. Saw NP at the job site and given wrist splint and told to use Tylenol  since breast feeding. Wearing a wrist splint has improved her symptoms but still gets flare ups. She denies any known injury to the wrist and takes Tylenol  as needed for pain, approximately every six hours when symptoms worsen.  HM: She has not received a COVID booster this fall and does not plan to get one at this time. Her last Pap smear in January 2024 was normal, and she tested negative for HPV.  We had discussed HPV virus series vaccine on last visit in July and she had decided to hold off on getting it.  She is in monogamous relationship with her husband.  Today we discussed this again and she does want to get  the vaccine series as long as okay to get when breast feeding.  No issues with hearing, vision, or dental care, although she notes some staining on her teeth due to coffee and ice consumption. She does not smoke or use  alcohol or street drugs. She drinks coffee and tea regularly and engages in daily walking for exercise. Her diet includes rice twice a day, fruits, and vegetables, and she is conscious about her health as she ages.  No problems with hearing, vision, swallowing, sore throat, chest pain, heart racing, stomach pain, blood in stools, blood in urine, and shortness of breath. She reports occasional numbness and tingling in her fingers, particularly when waking up.    Patient Active Problem List   Diagnosis Date Noted   Immunization due 08/20/2024   Post term pregnancy over 40 weeks 05/07/2023   Unwanted fertility 01/16/2023   Advanced maternal age in multigravida 10/24/2022   Polyp at cervical os 10/24/2022   Vaginal bleeding during pregnancy, antepartum 10/24/2022   Supervision of high risk pregnancy, antepartum 09/26/2022     Medications Ordered Prior to Encounter[1]  Allergies[2]  Social History   Socioeconomic History   Marital status: Married    Spouse name: Not on file   Number of children: 1   Years of education: completed high school   Highest education level: Not on file  Occupational History   Occupation: retail  Tobacco Use   Smoking status: Never    Passive exposure: Past (mother smoked at home)   Smokeless tobacco: Never  Vaping Use   Vaping status: Never Used  Substance and Sexual Activity   Alcohol use: No   Drug use: No   Sexual activity: Yes  Birth control/protection: Surgical  Other Topics Concern   Not on file  Social History Narrative   Lives at home with family.   Left-handed.   2-3 cups tea daily.   Social Drivers of Health   Tobacco Use: Low Risk (12/04/2023)   Patient History    Smoking Tobacco Use: Never    Smokeless Tobacco Use: Never    Passive Exposure: Past  Financial Resource Strain: Low Risk (09/20/2023)   Overall Financial Resource Strain (CARDIA)    Difficulty of Paying Living Expenses: Not very hard  Food Insecurity: No Food  Insecurity (09/20/2023)   Hunger Vital Sign    Worried About Running Out of Food in the Last Year: Never true    Ran Out of Food in the Last Year: Never true  Transportation Needs: No Transportation Needs (09/20/2023)   PRAPARE - Administrator, Civil Service (Medical): No    Lack of Transportation (Non-Medical): No  Physical Activity: Insufficiently Active (09/20/2023)   Exercise Vital Sign    Days of Exercise per Week: 2 days    Minutes of Exercise per Session: 10 min  Stress: No Stress Concern Present (09/20/2023)   [employer] of Occupational Health - Occupational Stress Questionnaire    Feeling of Stress : Not at all  Social Connections: Socially Isolated (09/20/2023)   Social Connection and Isolation Panel    Frequency of Communication with Friends and Family: Never    Frequency of Social Gatherings with Friends and Family: Twice a week    Attends Religious Services: Never    Database Administrator or Organizations: No    Attends Banker Meetings: Never    Marital Status: Married  Catering Manager Violence: Not At Risk (09/20/2023)   Humiliation, Afraid, Rape, and Kick questionnaire    Fear of Current or Ex-Partner: No    Emotionally Abused: No    Physically Abused: No    Sexually Abused: No  Depression (PHQ2-9): Low Risk (04/14/2024)   Depression (PHQ2-9)    PHQ-2 Score: 1  Alcohol Screen: Low Risk (09/20/2023)   Alcohol Screen    Last Alcohol Screening Score (AUDIT): 0  Housing: Low Risk (09/20/2023)   Housing    Last Housing Risk Score: 0  Utilities: Not At Risk (05/07/2023)   AHC Utilities    Threatened with loss of utilities: No  Health Literacy: Inadequate Health Literacy (09/20/2023)   B1300 Health Literacy    Frequency of need for help with medical instructions: Sometimes    Family History  Problem Relation Age of Onset   Hypertension Father    Dementia Father    Dementia Mother     Past Surgical History:  Procedure Laterality Date    HIP ARTHROSCOPY Left 07/07/2018   Procedure: Left hip arthroscopic loose body removal;  Surgeon: Sharl Selinda Dover, MD;  Location: [facility] OR;  Service: Orthopedics;  Laterality: Left;  120 mins   HIP CLOSED REDUCTION Left 05/29/2018   Procedure: CLOSED REDUCTION HIP;  Surgeon: Kendal Franky SQUIBB, MD;  Location: [facility] OR;  Service: Orthopedics;  Laterality: Left;   TUBAL LIGATION Bilateral 05/09/2023   Procedure: POST PARTUM TUBAL LIGATION;  Surgeon: Eldonna Suzen Octave, MD;  Location: [facility] LD ORS;  Service: Gynecology;  Laterality: Bilateral;    ROS: Review of Systems  Constitutional:        Walks daily for 30 mins. Eats rice 2x/day but brown rice. Eats 2 fruits a day. Drinks mainly tea/coffee and water.  HENT:  Negative  for congestion, dental problem, ear pain, sore throat and trouble swallowing.        Does not have a dentist.  Eyes:  Negative for visual disturbance.       Last eye exam in [id number].  Respiratory:  Negative for cough and shortness of breath.   Cardiovascular:  Negative for chest pain and palpitations.  Gastrointestinal:  Negative for abdominal distention, abdominal pain and blood in stool.  Genitourinary:  Negative for difficulty urinating and hematuria.   Negative except as stated above  PHYSICAL EXAM: BP 97/62 (BP Location: Left Arm, Patient Position: Sitting, Cuff Size: Normal)   Pulse 79   Ht 5' (1.524 m)   Wt 147 lb (66.7 kg)   SpO2 98%   BMI 28.71 kg/m   Wt Readings from Last 3 Encounters:  10/16/24 147 lb (66.7 kg)  04/14/24 145 lb (65.8 kg)  12/04/23 154 lb 9.6 oz (70.1 kg)    Physical Exam  General appearance - alert, well appearing, middle-age female and in no distress Mental status - normal mood, behavior, speech, dress, motor activity, and thought processes Eyes - pupils equal and reactive, extraocular eye movements intact Ears - bilateral TM's and external ear canals normal Nose - normal and patent, no erythema, discharge or polyps Mouth - mucous  membranes moist, pharynx normal without lesions.  Teeth are stained. Neck - supple, no significant adenopathy Lymphatics - no palpable lymphadenopathy, no hepatosplenomegaly Chest - clear to auscultation, no wheezes, rales or rhonchi, symmetric air entry Heart - normal rate, regular rhythm, normal S1, S2, no murmurs, rubs, clicks or gallops Abdomen - soft, nontender, nondistended, no masses or organomegaly Neurological - cranial nerves II through XII intact, motor and sensory grossly normal bilaterally Musculoskeletal - RT wrist: No edema or erythema noted.  Good range of motion.  Mild tenderness along the radial aspect of the wrist and thumb.  Good range of motion of the thumb.  Grip 5/5 bilaterally. Extremities - peripheral pulses normal, no pedal edema, no clubbing or cyanosis Skin - normal coloration and turgor, no rashes, no suspicious skin lesions noted      Latest Ref Rng & Units 10/24/2022    9:51 AM 06/05/2021    9:15 AM 06/04/2018   11:38 AM  CMP  Glucose 70 - 99 mg/dL 81  87  893   BUN 6 - 20 mg/dL 8  10  5    Creatinine 0.57 - 1.00 mg/dL 9.43  9.27  9.25   Sodium 134 - 144 mmol/L 138  138  139   Potassium 3.5 - 5.2 mmol/L 3.9  4.3  3.7   Chloride 96 - 106 mmol/L 103  104  108   CO2 20 - 29 mmol/L 19   23   Calcium 8.7 - 10.2 mg/dL 9.0  9.0  8.6   Total Protein 6.0 - 8.5 g/dL 6.5  7.2  6.2   Total Bilirubin 0.0 - 1.2 mg/dL 0.4  0.5  1.2   Alkaline Phos 44 - 121 IU/L 52  60  55   AST 0 - 40 IU/L 12  15  37   ALT 0 - 32 IU/L 10  14  38    Lipid Panel     Component Value Date/Time   CHOL 190 06/05/2021 0915   TRIG 56 06/05/2021 0915   HDL 55 06/05/2021 0915   CHOLHDL 3.5 06/05/2021 0915   LDLCALC 124 (H) 06/05/2021 0915    CBC    Component Value Date/Time  WBC 7.4 05/07/2023 0710   RBC 4.19 05/07/2023 0710   HGB 13.0 05/07/2023 0710   HGB 12.7 02/14/2023 0815   HCT 39.2 05/07/2023 0710   HCT 39.8 02/14/2023 0815   PLT 191 05/07/2023 0710   PLT 175 02/14/2023  0815   MCV 93.6 05/07/2023 0710   MCV 88 02/14/2023 0815   MCH 31.0 05/07/2023 0710   MCHC 33.2 05/07/2023 0710   RDW 14.4 05/07/2023 0710   RDW 14.5 02/14/2023 0815   LYMPHSABS 0.8 10/24/2022 0951   MONOABS 0.5 06/04/2018 1138   EOSABS 0.1 10/24/2022 0951   BASOSABS 0.0 10/24/2022 0951    ASSESSMENT AND PLAN: 1. Encounter for annual physical exam (Primary) Advised to get established with a dentist and do dental cleaning twice a year. Advise getting routine eye exam at least once every 2 years - CBC - Comprehensive metabolic panel with GFR - Lipid panel  2. Carpal tunnel syndrome of right wrist Symptoms suggest carpal tunnel syndrome and tenosynovitis of the wrist.  Discussed diagnosis with patient.  Likely related to repetitive use of her hands at work.  Advised using her wrist splint while at work and also night times.  3. Tenosynovitis of right wrist Patient not wanting to take oral NSAIDs because she is breast-feeding.  I recommend trying diclofenac  gel.  She prefers to stick with the Tylenol .  Other option given was referral to orthopedics for steroid injection but she wants to hold off on that for now as well  4. Need for HPV vaccine We discussed the HPV vaccine series.  Best to receive it in preteen years but can be given up to age 86.  I did look up on up to date and it stated that it was safe to give him breast-feeding women.  However patient wanted to hold off until she has completed breast-feeding.  5. Overweight (BMI 25.0-29.9) Encouraged her to continue regular exercise.  She indicates that she walks almost daily for 30 minutes.  Discussed and encouraged healthy eating habits.  Encouraged her to cut back on eating rice to just once a day instead of twice a day.  Incorporate fresh fruits and vegetables into the diet daily and try to eat more lean meat. - CBC - Comprehensive metabolic panel with GFR - Lipid panel   Patient was given the opportunity to ask questions.   Patient verbalized understanding of the plan and was able to repeat key elements of the plan.   This documentation was completed using Paediatric nurse.  Any transcriptional errors are unintentional.  Orders Placed This Encounter  Procedures   CBC   Comprehensive metabolic panel with GFR   Lipid panel     Requested Prescriptions    No prescriptions requested or ordered in this encounter    Return if symptoms worsen or fail to improve.  Barnie Louder, MD, FACP     [1]  Current Outpatient Medications on File Prior to Visit  Medication Sig Dispense Refill   acetaminophen  (TYLENOL ) 325 MG tablet Take 2 tablets (650 mg total) by mouth every 4 (four) hours as needed (for pain scale < 4). (Patient not taking: Reported on 10/16/2024) 90 tablet 1   famotidine  (PEPCID ) 20 MG tablet Take 1 tablet by mouth twice daily (Patient not taking: Reported on 10/16/2024) 60 tablet 0   ibuprofen  (ADVIL ) 600 MG tablet Take 1 tablet (600 mg total) by mouth every 6 (six) hours. (Patient not taking: Reported on 10/16/2024) 30 tablet 0  promethazine  (PHENERGAN ) 25 MG tablet Take 1 tablet (25 mg total) by mouth every 6 (six) hours as needed for nausea or vomiting. (Patient not taking: Reported on 10/16/2024) 30 tablet 1   sodium chloride  (OCEAN) 0.65 % SOLN nasal spray Place 2 sprays into both nostrils every 2 (two) hours while awake. (Patient not taking: Reported on 10/16/2024)     No current facility-administered medications on file prior to visit.  [2] No Known Allergies  "

## 2024-10-16 NOTE — Patient Instructions (Signed)
 " VISIT SUMMARY: Today, you came in for your annual physical exam. We discussed your right wrist pain, your current breastfeeding status, and general health maintenance. You also shared your dietary habits and exercise routine.  YOUR PLAN: -CARPAL TUNNEL SYNDROME AND TENOSYNOVITIS OF RIGHT WRIST: Carpal tunnel syndrome is a condition where the median nerve in the wrist is compressed, causing pain and tingling. Tenosynovitis is inflammation of the tendon sheath. Continue using the wrist splint at work and during sleep, and take Tylenol  as needed for pain. If symptoms persist, consider seeing an orthopedic specialist for a possible steroid injection.  -OVERWEIGHT: Being slightly overweight can impact overall health. To help manage your weight, reduce your rice intake to once daily, increase your servings of fruits and vegetables to at least three per day, and incorporate lean meats like chicken, turkey, and seafood into your diet. Consider switching to wheat bread instead of white bread.  -GENERAL HEALTH MAINTENANCE: We discussed deferring the HPV vaccination until after you finish breastfeeding. It's important to schedule an eye exam since your last one was in 2024. Also, contact us  for a list of dentists who accept Medicaid for your dental cleanings.  INSTRUCTIONS: Please continue using the wrist splint and taking Tylenol  as needed for your wrist pain. If your symptoms do not improve, consider an orthopedic referral for a steroid injection. Reduce your rice intake, increase your fruit and vegetable servings, and incorporate lean meats into your diet. Schedule an eye exam and contact us  for a list of dentists accepting Medicaid for dental cleanings. Defer the HPV vaccination until after breastfeeding.   Preventive Care 63-41 Years Old, Female Preventive care refers to lifestyle choices and visits with your health care provider that can promote health and wellness. Preventive care visits are also called  wellness exams. What can I expect for my preventive care visit? Counseling During your preventive care visit, your health care provider may ask about your: Medical history, including: Past medical problems. Family medical history. Pregnancy history. Current health, including: Menstrual cycle. Method of birth control. Emotional well-being. Home life and relationship well-being. Sexual activity and sexual health. Lifestyle, including: Alcohol, nicotine or tobacco, and drug use. Access to firearms. Diet, exercise, and sleep habits. Work and work astronomer. Sunscreen use. Safety issues such as seatbelt and bike helmet use. Physical exam Your health care provider may check your: Height and weight. These may be used to calculate your BMI (body mass index). BMI is a measurement that tells if you are at a healthy weight. Waist circumference. This measures the distance around your waistline. This measurement also tells if you are at a healthy weight and may help predict your risk of certain diseases, such as type 2 diabetes and high blood pressure. Heart rate and blood pressure. Body temperature. Skin for abnormal spots. What immunizations do I need?  Vaccines are usually given at various ages, according to a schedule. Your health care provider will recommend vaccines for you based on your age, medical history, and lifestyle or other factors, such as travel or where you work. What tests do I need? Screening Your health care provider may recommend screening tests for certain conditions. This may include: Pelvic exam and Pap test. Lipid and cholesterol levels. Diabetes screening. This is done by checking your blood sugar (glucose) after you have not eaten for a while (fasting). Hepatitis B test. Hepatitis C test. HIV (human immunodeficiency virus) test. STI (sexually transmitted infection) testing, if you are at risk. BRCA-related cancer screening. This may  be done if you have a  family history of breast, ovarian, tubal, or peritoneal cancers. Talk with your health care provider about your test results, treatment options, and if necessary, the need for more tests. Follow these instructions at home: Eating and drinking  Eat a healthy diet that includes fresh fruits and vegetables, whole grains, lean protein, and low-fat dairy products. Take vitamin and mineral supplements as recommended by your health care provider. Do not drink alcohol if: Your health care provider tells you not to drink. You are pregnant, may be pregnant, or are planning to become pregnant. If you drink alcohol: Limit how much you have to 0-1 drink a day. Know how much alcohol is in your drink. In the [place] one drink equals one 12 oz bottle of beer (355 mL), one 5 oz glass of wine (148 mL), or one 1 oz glass of hard liquor (44 mL). Lifestyle Brush your teeth every morning and night with fluoride toothpaste. Floss one time each day. Exercise for at least 30 minutes 5 or more days each week. Do not use any products that contain nicotine or tobacco. These products include cigarettes, chewing tobacco, and vaping devices, such as e-cigarettes. If you need help quitting, ask your health care provider. Do not use drugs. If you are sexually active, practice safe sex. Use a condom or other form of protection to prevent STIs. If you do not wish to become pregnant, use a form of birth control. If you plan to become pregnant, see your health care provider for a prepregnancy visit. Find healthy ways to manage stress, such as: Meditation, yoga, or listening to music. Journaling. Talking to a trusted person. Spending time with friends and family. Minimize exposure to UV radiation to reduce your risk of skin cancer. Safety Always wear your seat belt while driving or riding in a vehicle. Do not drive: If you have been drinking alcohol. Do not ride with someone who has been drinking. If you have been using any  mind-altering substances or drugs. While texting. When you are tired or distracted. Wear a helmet and other protective equipment during sports activities. If you have firearms in your house, make sure you follow all gun safety procedures. Seek help if you have been physically or sexually abused. What's next? Go to your health care provider once a year for an annual wellness visit. Ask your health care provider how often you should have your eyes and teeth checked. Stay up to date on all vaccines. This information is not intended to replace advice given to you by your health care provider. Make sure you discuss any questions you have with your health care provider. Document Revised: 03/29/2021 Document Reviewed: 03/29/2021 Elsevier Patient Education  2024 [employer] Inc[employer]                   Contains text generated by [employer].                                 Contains text generated by [employer].   "

## 2024-10-17 ENCOUNTER — Ambulatory Visit: Payer: Self-pay | Admitting: Internal Medicine

## 2024-10-17 LAB — COMPREHENSIVE METABOLIC PANEL WITH GFR
ALT: 17 IU/L (ref 0–32)
AST: 16 IU/L (ref 0–40)
Albumin: 4.5 g/dL (ref 3.9–4.9)
Alkaline Phosphatase: 72 IU/L (ref 41–116)
BUN/Creatinine Ratio: 15 (ref 9–23)
BUN: 13 mg/dL (ref 6–20)
Bilirubin Total: 0.5 mg/dL (ref 0.0–1.2)
CO2: 24 mmol/L (ref 20–29)
Calcium: 9.6 mg/dL (ref 8.7–10.2)
Chloride: 103 mmol/L (ref 96–106)
Creatinine, Ser: 0.86 mg/dL (ref 0.57–1.00)
Globulin, Total: 2.8 g/dL (ref 1.5–4.5)
Glucose: 101 mg/dL — ABNORMAL HIGH (ref 70–99)
Potassium: 4.7 mmol/L (ref 3.5–5.2)
Sodium: 140 mmol/L (ref 134–144)
Total Protein: 7.3 g/dL (ref 6.0–8.5)
eGFR: 88 mL/min/1.73

## 2024-10-17 LAB — LIPID PANEL
Chol/HDL Ratio: 5.9 ratio — ABNORMAL HIGH (ref 0.0–4.4)
Cholesterol, Total: 237 mg/dL — ABNORMAL HIGH (ref 100–199)
HDL: 40 mg/dL
LDL Chol Calc (NIH): 119 mg/dL — ABNORMAL HIGH (ref 0–99)
Triglycerides: 441 mg/dL — ABNORMAL HIGH (ref 0–149)
VLDL Cholesterol Cal: 78 mg/dL — ABNORMAL HIGH (ref 5–40)

## 2024-10-17 LAB — CBC
Hematocrit: 41.1 % (ref 34.0–46.6)
Hemoglobin: 13.3 g/dL (ref 11.1–15.9)
MCH: 29.6 pg (ref 26.6–33.0)
MCHC: 32.4 g/dL (ref 31.5–35.7)
MCV: 91 fL (ref 79–97)
Platelets: 291 x10E3/uL (ref 150–450)
RBC: 4.5 x10E6/uL (ref 3.77–5.28)
RDW: 12.8 % (ref 11.7–15.4)
WBC: 6.5 x10E3/uL (ref 3.4–10.8)
